# Patient Record
Sex: Male | Born: 1975 | Race: White | Hispanic: No | Marital: Married | Smoking: Never smoker
Health system: Southern US, Community
[De-identification: ages and names within clinical notes are randomized; demographics above are authoritative.]

## PROBLEM LIST (undated history)

## (undated) DIAGNOSIS — E785 Hyperlipidemia, unspecified: Secondary | ICD-10-CM

## (undated) DIAGNOSIS — Z9282 Status post administration of tPA (rtPA) in a different facility within the last 24 hours prior to admission to current facility: Secondary | ICD-10-CM

## (undated) DIAGNOSIS — D649 Anemia, unspecified: Secondary | ICD-10-CM

## (undated) DIAGNOSIS — R079 Chest pain, unspecified: Secondary | ICD-10-CM

## (undated) DIAGNOSIS — I10 Essential (primary) hypertension: Secondary | ICD-10-CM

## (undated) HISTORY — DX: Status post administration of tPA (rtPA) in a different facility within the last 24 hours prior to admission to current facility: Z92.82

## (undated) HISTORY — DX: Chest pain, unspecified: R07.9

## (undated) HISTORY — PX: TYMPANOPLASTY WITH GRAFT: SHX6567

---

## 2016-07-29 DIAGNOSIS — I639 Cerebral infarction, unspecified: Secondary | ICD-10-CM

## 2016-07-29 HISTORY — DX: Cerebral infarction, unspecified: I63.9

## 2016-07-30 ENCOUNTER — Inpatient Hospital Stay (HOSPITAL_COMMUNITY): Payer: 59

## 2016-07-30 ENCOUNTER — Inpatient Hospital Stay (HOSPITAL_COMMUNITY)
Admission: AD | Admit: 2016-07-30 | Discharge: 2016-07-31 | DRG: 065 | Disposition: A | Discharge status: Home / Self Care | Payer: 59 | Source: Other Acute Inpatient Hospital | Attending: Neurology | Admitting: Neurology

## 2016-07-30 DIAGNOSIS — I639 Cerebral infarction, unspecified: Secondary | ICD-10-CM | POA: Diagnosis present

## 2016-07-30 DIAGNOSIS — F419 Anxiety disorder, unspecified: Secondary | ICD-10-CM | POA: Diagnosis present

## 2016-07-30 DIAGNOSIS — R29703 NIHSS score 3: Secondary | ICD-10-CM | POA: Diagnosis present

## 2016-07-30 DIAGNOSIS — Z23 Encounter for immunization: Secondary | ICD-10-CM

## 2016-07-30 DIAGNOSIS — Z6838 Body mass index (BMI) 38.0-38.9, adult: Secondary | ICD-10-CM | POA: Diagnosis not present

## 2016-07-30 DIAGNOSIS — E669 Obesity, unspecified: Secondary | ICD-10-CM | POA: Diagnosis present

## 2016-07-30 DIAGNOSIS — R011 Cardiac murmur, unspecified: Secondary | ICD-10-CM | POA: Diagnosis present

## 2016-07-30 DIAGNOSIS — R2990 Unspecified symptoms and signs involving the nervous system: Secondary | ICD-10-CM | POA: Diagnosis present

## 2016-07-30 DIAGNOSIS — G8194 Hemiplegia, unspecified affecting left nondominant side: Secondary | ICD-10-CM | POA: Diagnosis present

## 2016-07-30 DIAGNOSIS — Z79899 Other long term (current) drug therapy: Secondary | ICD-10-CM

## 2016-07-30 DIAGNOSIS — I6789 Other cerebrovascular disease: Secondary | ICD-10-CM

## 2016-07-30 DIAGNOSIS — I10 Essential (primary) hypertension: Secondary | ICD-10-CM | POA: Diagnosis present

## 2016-07-30 DIAGNOSIS — E785 Hyperlipidemia, unspecified: Secondary | ICD-10-CM

## 2016-07-30 DIAGNOSIS — R51 Headache: Secondary | ICD-10-CM | POA: Diagnosis present

## 2016-07-30 DIAGNOSIS — K219 Gastro-esophageal reflux disease without esophagitis: Secondary | ICD-10-CM | POA: Diagnosis present

## 2016-07-30 DIAGNOSIS — Z9282 Status post administration of tPA (rtPA) in a different facility within the last 24 hours prior to admission to current facility: Secondary | ICD-10-CM

## 2016-07-30 HISTORY — DX: Unspecified symptoms and signs involving the nervous system: R29.90

## 2016-07-30 LAB — PROTIME-INR
INR: 1.18
PROTHROMBIN TIME: 15 s (ref 11.4–15.2)

## 2016-07-30 LAB — COMPREHENSIVE METABOLIC PANEL
ALT: 30 U/L (ref 17–63)
AST: 27 U/L (ref 15–41)
Albumin: 4 g/dL (ref 3.5–5.0)
Alkaline Phosphatase: 86 U/L (ref 38–126)
Anion gap: 9 (ref 5–15)
BUN: 11 mg/dL (ref 6–20)
CHLORIDE: 106 mmol/L (ref 101–111)
CO2: 24 mmol/L (ref 22–32)
Calcium: 9 mg/dL (ref 8.9–10.3)
Creatinine, Ser: 1 mg/dL (ref 0.61–1.24)
Glucose, Bld: 116 mg/dL — ABNORMAL HIGH (ref 65–99)
POTASSIUM: 3.5 mmol/L (ref 3.5–5.1)
SODIUM: 139 mmol/L (ref 135–145)
Total Bilirubin: 0.9 mg/dL (ref 0.3–1.2)
Total Protein: 6.9 g/dL (ref 6.5–8.1)

## 2016-07-30 LAB — URINALYSIS, COMPLETE (UACMP) WITH MICROSCOPIC
Bacteria, UA: NONE SEEN
Bilirubin Urine: NEGATIVE
GLUCOSE, UA: NEGATIVE mg/dL
HGB URINE DIPSTICK: NEGATIVE
Ketones, ur: NEGATIVE mg/dL
Leukocytes, UA: NEGATIVE
Nitrite: NEGATIVE
Protein, ur: NEGATIVE mg/dL
SPECIFIC GRAVITY, URINE: 1.025 (ref 1.005–1.030)
Squamous Epithelial / LPF: NONE SEEN
pH: 6 (ref 5.0–8.0)

## 2016-07-30 LAB — CBC
HCT: 45.9 % (ref 39.0–52.0)
Hemoglobin: 15.3 g/dL (ref 13.0–17.0)
MCH: 28.4 pg (ref 26.0–34.0)
MCHC: 33.3 g/dL (ref 30.0–36.0)
MCV: 85.3 fL (ref 78.0–100.0)
PLATELETS: 237 10*3/uL (ref 150–400)
RBC: 5.38 MIL/uL (ref 4.22–5.81)
RDW: 13.7 % (ref 11.5–15.5)
WBC: 7.2 10*3/uL (ref 4.0–10.5)

## 2016-07-30 LAB — LIPID PANEL
CHOL/HDL RATIO: 5.3 ratio
CHOLESTEROL: 176 mg/dL (ref 0–200)
HDL: 33 mg/dL — AB (ref >40)
LDL Cholesterol: 117 mg/dL — ABNORMAL HIGH (ref 0–99)
TRIGLYCERIDES: 131 mg/dL (ref <150)
VLDL: 26 mg/dL (ref 0–40)

## 2016-07-30 LAB — ECHOCARDIOGRAM COMPLETE
HEIGHTINCHES: 71 in
WEIGHTICAEL: 4405.67 [oz_av]

## 2016-07-30 LAB — TROPONIN I

## 2016-07-30 LAB — RAPID URINE DRUG SCREEN, HOSP PERFORMED
AMPHETAMINES: NOT DETECTED
BARBITURATES: NOT DETECTED
Benzodiazepines: NOT DETECTED
Cocaine: NOT DETECTED
OPIATES: NOT DETECTED
TETRAHYDROCANNABINOL: NOT DETECTED

## 2016-07-30 LAB — APTT: aPTT: 31 seconds (ref 24–36)

## 2016-07-30 LAB — MRSA PCR SCREENING: MRSA by PCR: NEGATIVE

## 2016-07-30 MED ORDER — OXYCODONE-ACETAMINOPHEN 5-325 MG PO TABS: 1.0000 | ORAL_TABLET | Freq: Three times a day (TID) | ORAL | Status: DC | PRN

## 2016-07-30 MED ORDER — CITALOPRAM HYDROBROMIDE 40 MG PO TABS
40.0000 mg | ORAL_TABLET | Freq: Every day | ORAL | Status: DC
  Administered 2016-07-30: 40 mg via ORAL
  Filled 2016-07-30: qty 4

## 2016-07-30 MED ORDER — CLEVIDIPINE BUTYRATE 0.5 MG/ML IV EMUL: 0.0000 mg/h | INTRAVENOUS | Status: DC

## 2016-07-30 MED ORDER — ATORVASTATIN CALCIUM 40 MG PO TABS
40.0000 mg | ORAL_TABLET | Freq: Every day | ORAL | Status: DC
  Administered 2016-07-30: 40 mg via ORAL
  Filled 2016-07-30: qty 1

## 2016-07-30 MED ORDER — ACETAMINOPHEN 325 MG PO TABS: 650.0000 mg | ORAL_TABLET | ORAL | Status: DC | PRN

## 2016-07-30 MED ORDER — SENNOSIDES-DOCUSATE SODIUM 8.6-50 MG PO TABS: 1.0000 | ORAL_TABLET | Freq: Every evening | ORAL | Status: DC | PRN

## 2016-07-30 MED ORDER — ACETAMINOPHEN 650 MG RE SUPP: 650.0000 mg | RECTAL | Status: DC | PRN

## 2016-07-30 MED ORDER — INFLUENZA VAC SPLIT QUAD 0.5 ML IM SUSY: 0.5000 mL | PREFILLED_SYRINGE | INTRAMUSCULAR | Status: DC

## 2016-07-30 MED ORDER — PANTOPRAZOLE SODIUM 40 MG IV SOLR: 40.0000 mg | Freq: Every day | INTRAVENOUS | Status: DC

## 2016-07-30 MED ORDER — ACETAMINOPHEN 160 MG/5ML PO SOLN: 650.0000 mg | ORAL | Status: DC | PRN

## 2016-07-30 MED ORDER — STROKE: EARLY STAGES OF RECOVERY BOOK
Freq: Once | Status: AC
  Administered 2016-07-31: 06:00:00
  Filled 2016-07-30: qty 1

## 2016-07-30 MED ORDER — PANTOPRAZOLE SODIUM 40 MG PO TBEC
40.0000 mg | DELAYED_RELEASE_TABLET | Freq: Every day | ORAL | Status: DC
  Administered 2016-07-30 – 2016-07-31 (×2): 40 mg via ORAL
  Filled 2016-07-30 (×2): qty 1

## 2016-07-30 MED ORDER — INFLUENZA VAC SPLIT QUAD 0.5 ML IM SUSY
0.5000 mL | PREFILLED_SYRINGE | INTRAMUSCULAR | Status: AC
  Administered 2016-07-30: 0.5 mL via INTRAMUSCULAR
  Filled 2016-07-30: qty 0.5

## 2016-07-30 MED ORDER — OXYCODONE-ACETAMINOPHEN 5-325 MG PO TABS
1.0000 | ORAL_TABLET | ORAL | Status: DC | PRN
  Administered 2016-07-30 (×3): 1 via ORAL
  Filled 2016-07-30 (×4): qty 1

## 2016-07-30 MED ORDER — IOPAMIDOL (ISOVUE-370) INJECTION 76%
INTRAVENOUS | Status: AC
  Administered 2016-07-30: 50 mL
  Filled 2016-07-30: qty 50

## 2016-07-30 MED ORDER — LABETALOL HCL 5 MG/ML IV SOLN: 20.0000 mg | Freq: Once | INTRAVENOUS | Status: DC

## 2016-07-30 MED ORDER — ASPIRIN EC 81 MG PO TBEC
81.0000 mg | DELAYED_RELEASE_TABLET | Freq: Every day | ORAL | Status: DC
  Administered 2016-07-30 – 2016-07-31 (×2): 81 mg via ORAL
  Filled 2016-07-30 (×2): qty 1

## 2016-07-30 MED ORDER — SODIUM CHLORIDE 0.9 % IV SOLN
INTRAVENOUS | Status: DC
  Administered 2016-07-30: 04:00:00 via INTRAVENOUS

## 2016-07-30 NOTE — Progress Notes (Signed)
STROKE TEAM PROGRESS NOTE   HISTORY OF PRESENT ILLNESS (per record) Robert Velez is an 41 y.o. male who was in USOH yesterday until 2:45 PM 07/29/2016 ([facility]KW), when he began having a severe headache. He took a Motrin and then at 3:30 PM his left foot began feeling "strange". He got out of his trunk and he fell, then noticing that his left foot and arm were weak. Also noted sensory numbness and tingling in the same distributions. Next, he drove himself home and from there to urgent care - he was told to go to the ED and arrived there at 4:30 PM. A telestroke consult was initiated and he was diagnosed with a stroke. IV tPA was administered and [facility] was [facility]. He states that his symptoms have significantly improved, with some residual numbness and tingling. He was admitted to the neuro ICU for further evaluation and treatment.   SUBJECTIVE (INTERVAL HISTORY) No family at bedside. MRI pending. Patient stated that after TPA he felt symptoms improved however not back to baseline yet. Still has mild left sided hemiparesis.    OBJECTIVE Temp:  [97.7 F (36.5 C)-98.3 F (36.8 C)] 97.9 F (36.6 C) (02/01 1600) Pulse Rate:  [73-88] 81 (02/01 1600) Cardiac Rhythm: Normal sinus rhythm (02/01 1600) Resp:  [12-24] 12 (02/01 1500) BP: (110-145)/(62-93) 118/72 (02/01 1600) SpO2:  [94 %-98 %] 94 % (02/01 1600) Weight:  [275 lb 5.7 oz (124.9 kg)] 275 lb 5.7 oz (124.9 kg) (02/01 0200)  CBC:   Recent Labs Lab 07/30/16 0350  WBC 7.2  HGB 15.3  HCT 45.9  MCV 85.3  PLT 237    Basic Metabolic Panel:   Recent Labs Lab 07/30/16 0350  NA 139  K 3.5  CL 106  CO2 24  GLUCOSE 116*  BUN 11  CREATININE 1.00  CALCIUM 9.0    Lipid Panel:     Component Value Date/Time   CHOL 176 07/30/2016 0350   TRIG 131 07/30/2016 0350   HDL 33 (L) 07/30/2016 0350   CHOLHDL 5.3 07/30/2016 0350   VLDL 26 07/30/2016 0350   LDLCALC 117 (H) 07/30/2016 0350   HgbA1c: No results found for:  HGBA1C Urine Drug Screen:     Component Value Date/Time   LABOPIA NONE DETECTED 07/30/2016 0356   COCAINSCRNUR NONE DETECTED 07/30/2016 0356   LABBENZ NONE DETECTED 07/30/2016 0356   AMPHETMU NONE DETECTED 07/30/2016 0356   THCU NONE DETECTED 07/30/2016 0356   LABBARB NONE DETECTED 07/30/2016 0356      IMAGING  CTA head and neck - OSH - unremarkable  MRI pending  TTE  - Normal LV systolic function; probable mild diastolic dysfunction;   mildly calcified aortic valve with trace AI; cannot exclude   oscillating density; suggest TEE to further assess.   PHYSICAL EXAM  Temp:  [97.7 F (36.5 C)-98.3 F (36.8 C)] 97.9 F (36.6 C) (02/01 1600) Pulse Rate:  [73-88] 81 (02/01 1600) Resp:  [12-24] 12 (02/01 1500) BP: (110-145)/(62-93) 118/72 (02/01 1600) SpO2:  [94 %-98 %] 94 % (02/01 1600) Weight:  [275 lb 5.7 oz (124.9 kg)] 275 lb 5.7 oz (124.9 kg) (02/01 0200)  General - Well nourished, well developed, in no apparent distress.  Ophthalmologic - Sharp disc margins OU.   Cardiovascular - Regular rate and rhythm.  Mental Status -  Level of arousal and orientation to time, place, and person were intact. Language including expression, naming, repetition, comprehension was assessed and found intact. Attention span and concentration were  normal. Recent and remote memory were intact. Fund of Knowledge was assessed and was intact.  Cranial Nerves II - XII - II - Visual field intact OU. III, IV, VI - Extraocular movements intact. V - Facial sensation intact bilaterally. VII - Facial movement intact bilaterally. VIII - Hearing & vestibular intact bilaterally. X - Palate elevates symmetrically. XI - Chin turning & shoulder shrug intact bilaterally. XII - Tongue protrusion intact.  Motor Strength - The patient's strength was normal in all extremities except subtle left arm pronator drift and left knee flexion 5-/5 and left foot DF 4/5.  Bulk was normal and fasciculations were  absent.   Motor Tone - Muscle tone was assessed at the neck and appendages and was normal.  Reflexes - The patient's reflexes were 1+ in all extremities and he had no pathological reflexes.  Sensory - Light touch, temperature/pinprick, vibration and proprioception were assessed and were symmetrical.    Coordination - The patient had normal movements in the hands and feet with no ataxia or dysmetria.  Tremor was absent.  Gait and Station - deferred.   ASSESSMENT/PLAN [name] is a 41 y.o. male with history of Heart murmur presenting with left-sided weakness and numbness. He was transferred to [facility] after receiving IV t-PA at OSH.   Possible stroke:  right subcortical infarct likely secondary to small vessel disease source  Resultant  subtle left-sided weakness  CTA head and neck at OSH unremarkable, no LVO  MRI  pending  2D Echo EF 60-65%  UDS negative  LDL 117  HgbA1c pending  SCDs for VTE prophylaxis Diet regular Room service appropriate? Yes; Fluid consistency: Thin  No antithrombotic prior to admission, now on No antithrombotic as within 24 hours of TPA. Plan aspirin 325 mg daily once 24 hour imaging negative for hemorrhage  Ongoing aggressive stroke risk factor management  Therapy recommendations:  CIR   Disposition:  pending   Hypertension  Stable  Blood pressure goal per post TPA guidelines   Hyperlipidemia  Home meds:  No statin  LDL 117, goal < 70  Add Lipitor 40 mg daily  Continue statin at discharge  Other Active Problems  Anxiety on Celexa  GERD, on Prilosec  Hospital day # 0  This patient is critically ill due to possible stroke s/p TPA and at significant risk of neurological worsening, death form recurrent stroke, hemorrhagic transformation, brain edema and cerebral herniation. This patient's care requires constant monitoring of vital signs, hemodynamics, respiratory and cardiac monitoring, review of multiple databases,  neurological assessment, discussion with family, other specialists and medical decision making of high complexity. I spent 35 minutes of neurocritical care time in the care of this patient.  Marvel Plan, MD PhD Stroke Neurology 07/30/2016 7:06 PM   To contact Stroke Continuity provider, please refer to [email or web address]. After hours, contact General Neurology

## 2016-07-30 NOTE — Progress Notes (Signed)
PT Cancellation Note  Patient Details Name: Robert Velez MRN: [id number] DOB: 12-12-1975   Cancelled Treatment:    Reason Eval/Treat Not Completed: Patient not medically ready Pt on strict bedrest. Will await increase in activity orders prior to initiation of PT evaluation. Will follow.   Blake DivineShauna A Laekyn Rayos 07/30/2016, 7:12 AM Mylo RedShauna Shyhiem Beeney, PT, DPT [phone]

## 2016-07-30 NOTE — Plan of Care (Signed)
Problem: Nutrition: Goal: Dietary intake will improve Outcome: Completed/Met Date Met: 07/30/16 Regular diet

## 2016-07-30 NOTE — Plan of Care (Signed)
Problem: Education: Goal: Knowledge of secondary prevention will improve Outcome: Progressing Discussed importance of recognizing signs and symptoms and importance of immediate medical attention.

## 2016-07-30 NOTE — Progress Notes (Signed)
Received from Neuro ICU via W/C, VSS, tele applied, oriented to room, safety precautions & pllan of care.

## 2016-07-30 NOTE — Evaluation (Addendum)
Physical Therapy Evaluation Patient Details Name: Robert Velez MRN: [id number] DOB: 08-Dec-1975 Today's Date: 07/30/2016   History of Present Illness  Patient is a 41 y/o male presents with LLE weakness and headache. [facility] Scans not updated in computer yet. s/p tPA.  Clinical Impression  Patient presents with left sided weakness, impaired sensation LUE/LE, blurry vision left eye and impaired mobility s/p above. Tolerated gait training with Min A for left knee instability. Pt with left foot drop demonstrating steppage gait to clear LLE during swing phase. Fatigues quickly due to increased WB through BUEs. Pt independent PTA. Would benefit from CIR to maximize independence and mobility prior to return home. Will follow acutely.    Follow Up Recommendations CIR;Supervision/Assistance - 24 hour    Equipment Recommendations  Other (comment) (TBA)    Recommendations for Other Services Rehab consult     Precautions / Restrictions Precautions Precautions: Fall Restrictions Weight Bearing Restrictions: No      Mobility  Bed Mobility Overal bed mobility: Needs Assistance Bed Mobility: Supine to Sit     Supine to sit: Supervision;HOB elevated     General bed mobility comments: No assist needed.   Transfers Overall transfer level: Needs assistance Equipment used: Rolling walker (2 wheeled);None Transfers: Sit to/from Stand Sit to Stand: Min guard         General transfer comment: Min guard to stand from EOB x2. 2 attempts to elevate from bed. Felt more comfortable having RW to hold onto once standing.  Ambulation/Gait Ambulation/Gait assistance: Min assist;+2 safety/equipment Ambulation Distance (Feet): 75 Feet Assistive device: Rolling walker (2 wheeled) Gait Pattern/deviations: Step-through pattern;Decreased stance time - left;Decreased step length - right;Trunk flexed;Steppage Gait velocity: decreased Gait velocity interpretation: Below normal speed  for age/gender General Gait Details: Pt with left foot drop and left knee instability during gait. Increased WB through BLEs. pt not placing full weight through LLE. Requires Min A to stabilize left knee during stance phase. Fatigues which results in worsening foot drop. 2 standing rest breaks. VSS.  Stairs            Wheelchair Mobility    Modified Rankin (Stroke Patients Only) Modified Rankin (Stroke Patients Only) Pre-Morbid Rankin Score: No symptoms Modified Rankin: Moderately severe disability     Balance Overall balance assessment: Needs assistance Sitting-balance support: Feet supported;No upper extremity supported Sitting balance-Leahy Scale: Good Sitting balance - Comments: Able to donn socks bringing foot onto thigh without difficulty.    Standing balance support: During functional activity Standing balance-Leahy Scale: Fair Standing balance comment: Able to stand without UE support.                             Pertinent Vitals/Pain Pain Assessment: Faces Faces Pain Scale: Hurts even more Pain Location: headache Pain Descriptors / Indicators: Headache Pain Intervention(s): Monitored during session;Premedicated before session;Repositioned    Home Living Family/patient expects to be discharged to:: Private residence Living Arrangements: Spouse/significant other;Children;Parent Available Help at Discharge: Family;Available 24 hours/day Type of Home: House Home Access: Stairs to enter   [employer] CorporationEntrance Stairs-Number of Steps: 2 Home Layout: Two level;Laundry or work area in basement;Able to live on main level with Pilgrim's Pridebedroom/bathroom Home Equipment: None      Prior Function Level of Independence: Independent         Comments: Works as Art gallery managerngineer- drives, cooks, 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 LLE Sensation: decreased light touch       Communication   Communication: No difficulties  Cognition Arousal/Alertness: Awake/alert Behavior During Therapy: WFL for tasks assessed/performed Overall Cognitive Status: Within Functional Limits for tasks assessed                      General Comments General comments (skin integrity, edema, etc.): Wife present during session.    Exercises     Assessment/Plan    PT Assessment Patient needs continued PT services  PT Problem List Decreased strength;Decreased mobility;Impaired tone;Decreased activity tolerance;Decreased balance;Impaired sensation;Decreased knowledge of use of DME          PT Treatment Interventions Gait training;Therapeutic exercise;Patient/family education;Balance training;Functional mobility training;Stair training;Neuromuscular re-education;DME instruction;Therapeutic activities    PT Goals (Current goals can be found in the Care Plan section)  Acute Rehab PT Goals Patient Stated Goal: to return to independence PT Goal Formulation: With patient Time For Goal Achievement: 08/13/16 Potential to Achieve Goals: Good    Frequency Min 3X/week   Barriers to discharge        Co-evaluation               End of Session Equipment Utilized During Treatment: Gait belt Activity Tolerance: Patient tolerated treatment well Patient left: in bed;with call bell/phone within reach;with SCD's reapplied;with family/visitor present Nurse Communication: Mobility status         Time: 1101-1135 PT Time Calculation (min) (ACUTE ONLY): 34 min   Charges:   PT Evaluation $PT Eval Moderate Complexity: 1 Procedure PT Treatments $Gait Training: 8-22 mins   PT G Codes:        Meegan Shanafelt A Izabellah Dadisman 07/30/2016, 4:08 PM Mylo Red, PT,  DPT [phone]

## 2016-07-30 NOTE — H&P (Signed)
Admission H&P    Chief Complaint: Stroke, status post tPA at OSH  HPI: Robert Velez is an 41 y.o. male who was in USOH yesterday until 2:45 PM, when he began having a severe headache. He took a Motrin and then at 3:30 PM his left foot began feeling "strange". He got out of his trunk and he fell, then noticing that his left foot and arm were weak. Also noted sensory numbness and tingling in the same distributions. Next, he drove himself home and from there to urgent care - he was told to go to the ED and arrived there at 4:30 PM. A telestroke consult was initiated and he was diagnosed with a stroke. IV tPA was administered and [facility] He states that his symptoms have significantly improved, with some residual numbness and tingling.   PMHx:  Anxiety "Dr. told me in the past that I had a heart murmur"  No past surgical history on file.  No family history on file. Social History: Denies smoking, EtOH or drug use  Allergies: NKDA  Home medications: Citalopram Omeprazole  ROS: 7/10 right temporal headache. No N/V currently. No chest or abdominal pain. No cough or SOB.   Physical Examination: Blood pressure (!) 145/92, pulse 75, resp. rate 20, SpO2 96 %.  HEENT-  Normocephalic/atraumatic. Lungs - No gross wheezing. Respirations unlabored.  Extremities - No edema.   Neurologic Examination: Ment: Intact to complex questions and commands. No receptive or expressive aphasia.  CN: PERRL, visual fields intact, EOMI, no nystagmus, facial temp sensation intact, VII normal bilaterally, hearing intact to questioning, no hypophonia, shoulder shrug normal, tongue protrudes midline.  Motor: 5/5 bilateral upper and lower extremities without asymmetry Sensory: Mild dysesthesia with FT testing of left hand and foot Reflexes: Normoactive x 4 without asymmetry. Toes downgoing bilaterally.  Cerebellar: No ataxia with FNF bilaterally Gait: Deferred.   No results found for this  or any previous visit (from the past 48 hour(s)). No results found.  Assessment: 41 y.o. male with acute onset of left arm and foot weakness, status post IV tPA at OSH.  1. No definite stroke risk factors.  2. Improved with tPA. Some residual sensory symptoms.  3. History of anxiety.   Plan: 1. Post-tPA order set to include frequent neuro checks and BP management.  2. No antiplatelet medications or anticoagulants. Can consider if CT head 24 hours following tPA is negative for hemorrhagic conversion.  3. DVT prophylaxis with SCDs 4. Echocardiogram 5. Carotid dopplers 6. Telemetry monitoring 7. MRI, MRA  of the brain without contrast 8. PT consult, OT consult, Speech consult 9. HgbA1c, fasting lipid panel  Electronically signed: Dr. Caryl PinaEric Giselle Brutus 07/30/2016, 2:11 AM

## 2016-07-30 NOTE — Progress Notes (Signed)
  Echocardiogram 2D Echocardiogram has been performed.  Robert Velez, Robert Velez R 07/30/2016, 3:39 PM

## 2016-07-30 NOTE — Progress Notes (Addendum)
[facility]. TPA given at 1736. Patient arrived via EMS on telemetry, pressure 125/91, O2 98% on RA, HR 81. Dr Otelia LimesLindzen notified of admission. Initial NIH 3, will continue to monitor patient.

## 2016-07-30 NOTE — Progress Notes (Signed)
Inpatient Rehabilitation  Per therapy request patient was screened by Grete Bosko for appropriateness for an Inpatient Acute Rehab consult.  At this time we are recommending an Inpatient Rehab consult.  MD paged; please order if you are agreeable.    Alixandra Alfieri, M.A., CCC/SLP Admission Coordinator  [facility]  Cell [phone]  

## 2016-07-30 NOTE — Plan of Care (Signed)
Problem: Education: Goal: Knowledge of disease or condition will improve Outcome: Progressing PT/OT consult

## 2016-07-31 DIAGNOSIS — G8194 Hemiplegia, unspecified affecting left nondominant side: Secondary | ICD-10-CM

## 2016-07-31 DIAGNOSIS — I10 Essential (primary) hypertension: Secondary | ICD-10-CM | POA: Diagnosis present

## 2016-07-31 DIAGNOSIS — K219 Gastro-esophageal reflux disease without esophagitis: Secondary | ICD-10-CM

## 2016-07-31 DIAGNOSIS — I639 Cerebral infarction, unspecified: Principal | ICD-10-CM

## 2016-07-31 DIAGNOSIS — F419 Anxiety disorder, unspecified: Secondary | ICD-10-CM

## 2016-07-31 HISTORY — DX: Anxiety disorder, unspecified: F41.9

## 2016-07-31 HISTORY — DX: Gastro-esophageal reflux disease without esophagitis: K21.9

## 2016-07-31 HISTORY — DX: Essential (primary) hypertension: I10

## 2016-07-31 LAB — HEMOGLOBIN A1C
Hgb A1c MFr Bld: 5.5 % (ref 4.8–5.6)
Mean Plasma Glucose: 111 mg/dL

## 2016-07-31 MED ORDER — ASPIRIN 81 MG PO TBEC: 81.0000 mg | DELAYED_RELEASE_TABLET | Freq: Every day | ORAL | 2 refills | Status: AC

## 2016-07-31 MED ORDER — ATORVASTATIN CALCIUM 40 MG PO TABS: 40.0000 mg | ORAL_TABLET | Freq: Every day | ORAL | 2 refills | Status: DC

## 2016-07-31 NOTE — Progress Notes (Signed)
Occupational Therapy Evaluation and treatment Patient Details Name: Robert Velez MRN: [id number] DOB: Dec 29, 1975 Today's Date: 07/31/2016    History of Present Illness Patient is a 41 y/o male presents with LLE weakness and headache. [facility] [facility]  A telestroke consult was initiated and he was diagnosed with a stroke. IV tPA was administered and [facility] was [facility]. He states that his symptoms have significantly improved, with some residual numbness and tingling   Clinical Impression   Pt seen for 2 visits. PTA, pt independent with ADL and mobility.  Pt with residual L sided weakness, L weaker than arm). Completed education regarding ADL and safe mobility for ADL. Pt having difficulty lifting L leg over tub. Practiced tub transfer with pt and wife using RW and 3 in1. Pt/wife able to return demonstrate safe technique. Pt states he feels safer with using RW at this time due to L LE weakness. Contacted case manager regarding need for 3 in 1 and RW to facilitate safe DC home. Also recommend outpt OT to facilitate safe return back to work.     Follow Up Recommendations  Outpatient OT;Supervision - Intermittent    Equipment Recommendations  3 in 1 bedside commode;Other (comment) (RW)    Recommendations for Other Services       Precautions / Restrictions Precautions Precautions: Fall Restrictions Weight Bearing Restrictions: No      Mobility Bed Mobility   Transfers Overall transfer level: Needs assistance Equipment used: None Transfers: Sit to/from Stand Sit to Stand: Modified independent (Device/Increase time)             Balance Overall balance assessment: Needs assistance Sitting-balance support: Feet supported;No upper extremity supported Sitting balance-Leahy Scale: Good Sitting balance - Comments: Able to donn socks bringing foot onto thigh without difficulty.    Standing balance support: During functional activity Standing balance-Leahy  Scale: Fair Standing balance comment: Difficulty with stepping over tub                            ADL Overall ADL's : Needs assistance/impaired                                 Tub/ Shower Transfer: Minimal assistance;Rolling walker   Functional mobility during ADLs: Supervision/safety;Rolling walker General ADL Comments: Pt overall set up for bathing and dressing. Will need to practice tub transfer  Practiced tub transfer on second visit. Wife/pt able to safely return demonstrate.     Vision Vision Assessment?: Yes Eye Alignment: Within Functional Limits Ocular Range of Motion: Within Functional Limits Alignment/Gaze Preference: Within Defined Limits Tracking/Visual Pursuits: Able to track stimulus in all quads without difficulty Saccades: Within functional limits Convergence: Within functional limits Visual Fields: No apparent deficits Additional Comments: complains of mild "blurriness in L eye"   Perception     Praxis      Pertinent Vitals/Pain Pain Assessment: No/denies pain Faces Pain Scale: Hurts even more     Hand Dominance Right   Extremity/Trunk Assessment Upper Extremity Assessment Upper Extremity Assessment: LUE deficits/detail LUE Deficits / Details: mild coordination deficits. strength overall WFL although shouler strength @ 4/5   Lower Extremity Assessment LLE Deficits / Details: Grossly ~1/5 DF/PF, 2+/5 knee extension, flexion, 2+/5 hip flexion. LLE Sensation: decreased light touch   Cervical / Trunk Assessment Cervical / Trunk Assessment: Normal   Communication Communication Communication: No difficulties  Cognition Arousal/Alertness: Awake/alert Behavior During Therapy: WFL for tasks assessed/performed Overall Cognitive Status: Impaired/Different from baseline Area of Impairment: Memory     Memory: Decreased short-term memory             General Comments       Exercises   Other Exercises Other Exercises:  U and LE coordination activities   Shoulder Instructions      Home Living Family/patient expects to be discharged to:: Private residence Living Arrangements: Spouse/significant other;Children;Parent Available Help at Discharge: Family;Available 24 hours/day Type of Home: House Home Access: Stairs to enter [employer] of Steps: 2   Home Layout: Two level;Laundry or work area in basement;Able to live on main level with bedroom/bathroom     Bathroom Shower/Tub: Therapist, sports characteristics: Engineer, building services: Pharmacist, community: Yes How Accessible: Accessible via walker Home Equipment: None      Lives With: Spouse    Prior Functioning/Environment Level of Independence: Independent        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        OT Problem List: Decreased strength;Decreased activity tolerance;Impaired balance (sitting and/or standing);Impaired vision/perception;Decreased coordination;Decreased safety awareness;Decreased knowledge of use of DME or AE;Impaired sensation;Impaired tone;Obesity   OT Treatment/Interventions:      OT Goals(Current goals can be found in the care plan section) Acute Rehab OT Goals Patient Stated Goal: to return to independence OT Goal Formulation: All assessment and education complete, DC therapy  OT Frequency:     Barriers to D/C:            Co-evaluation              End of Session Equipment Utilized During Treatment: Gait belt;Rolling walker Nurse Communication: Mobility status;Other (comment) (DC needs)  Activity Tolerance: Patient tolerated treatment well Patient left: in chair;with call bell/phone within reach;with family/visitor present   Time: 1610-9604 OT Time Calculation (min): 29 min Charges:  OT General Charges $OT Visit:  (2 visits) OT Evaluation $OT Eval Moderate Complexity: 1 Procedure OT Treatments $Self Care/Home Management : 23-37  mins G-Codes:    Sarea Fyfe,HILLARY 24-Aug-2016, 3:57 PM   [facility], OT/L  [phone] 08/24/16

## 2016-07-31 NOTE — Progress Notes (Signed)
Rehab admissions - Please see rehab consult done by Dr. Riley KillSwartz today.  Awaiting OT evaluation and PT update today.  [facility] and his wife can assist.  [facility] has [facility] commercial insurance so would need authorization if inpatient rehab becomes the plan.  Call me for questions.  #[phone]

## 2016-07-31 NOTE — Progress Notes (Signed)
D/c instructions reviewed with patient and spouse. Encouraged follow on appointments

## 2016-07-31 NOTE — Progress Notes (Signed)
Rehab admissions - Please see PT note recommending outpatient therapies.  Patient doing very well and will not require inpatient rehab.  #[phone]

## 2016-07-31 NOTE — Evaluation (Signed)
Speech Language Pathology Evaluation Patient Details Name: Robert Velez MRN: [id number] DOB: 1976-02-23 Today's Date: 07/31/2016 Time: [id number] SLP Time Calculation (min) (ACUTE ONLY): 18 min  Problem List:  Patient Active Problem List   Diagnosis Date Noted  . Acute ischemic stroke (HCC) 07/30/2016  . Stroke (cerebrum) (HCC) 07/30/2016  . Hyperlipidemia   . Tissue plasminogen activator (t-PA) administered at other facility within 24 hours prior to current admission    Past Medical History: No past medical history on file. Past Surgical History: No past surgical history on file. HPI:  Pt is a 41 y.o. male admitted 2/1 for left foot/ arm weakness, numbness and tingling. Symptoms significantly improved shortly after admission except for residual numbness and tingling. MRI showed no acute findings. Speech language eval ordered as part of stroke workup.    Assessment / Plan / Recommendation Clinical Impression  Pt currently presenting with mild cognitive deficits in the area of short-term memory and slowed processing of verbal information. Pt received a score of 26/30 on the MoCA- recalled 1 out of 5 words in delayed recall task and difficulty copying picture. Pt aware of deficits and expressed new difficulties with short term memory- recall of new information, names, etc. Wife reported that pt seems to repeat himself not realizing that he has already asked a question or shared a piece of information. Pt would benefit from continued speech therapy at next level of care to address short-term memory skills so that pt can have a successful transition back to work. Pt and wife agree with plan. Reported plan is for pt to move on to CIR. Will continue to follow.    SLP Assessment  Patient needs continued Speech Lanaguage Pathology Services    Follow Up Recommendations  Inpatient Rehab    Frequency and Duration min 1 x/week  1 week      SLP Evaluation Cognition  Overall Cognitive Status:  Impaired/Different from baseline Arousal/Alertness: Awake/alert Orientation Level: Oriented X4 Attention: Selective;Alternating Selective Attention: Appears intact Alternating Attention: Appears intact Memory: Impaired Memory Impairment: Decreased recall of new information;Decreased short term memory Decreased Short Term Memory: Verbal basic Awareness: Appears intact Problem Solving: Appears intact Safety/Judgment: Appears intact       Comprehension  Auditory Comprehension Overall Auditory Comprehension: Appears within functional limits for tasks assessed Yes/No Questions: Within Functional Limits Commands: Within Functional Limits Conversation: Complex Visual Recognition/Discrimination Discrimination:  (reports blurriness from L eye) Reading Comprehension Reading Status: Within funtional limits    Expression Expression Primary Mode of Expression: Verbal Verbal Expression Overall Verbal Expression: Appears within functional limits for tasks assessed Initiation: No impairment Level of Generative/Spontaneous Verbalization: Conversation Repetition: No impairment Naming: No impairment Pragmatics: No impairment Non-Verbal Means of Communication: Not applicable Written Expression Dominant Hand: Right   Oral / Motor  Oral Motor/Sensory Function Overall Oral Motor/Sensory Function: Within functional limits Motor Speech Overall Motor Speech: Appears within functional limits for tasks assessed Respiration: Within functional limits Phonation: Normal Resonance: Within functional limits Articulation: Within functional limitis Intelligibility: Intelligible Motor Planning: Witnin functional limits Motor Speech Errors: Not applicable   GO                    Metro KungOleksiak, Coulter Oldaker K, MA, CCC-SLP 07/31/2016, 10:37 AM [phone]

## 2016-07-31 NOTE — Discharge Summary (Signed)
Stroke Discharge Summary  Patient ID: Robert Velez    l   MRN: [id number]      DOB: 1975-10-26  Date of Admission: 07/30/2016 Date of Discharge: 07/31/2016  Attending Physician:  Marvel PlanJindong Kinga Cassar, MD, Stroke MD Patient's PCP:  No primary care provider on file.  DISCHARGE DIAGNOSIS: Principal Problem:   Stroke-like episode (HCC) s/p tPA Active Problems:   Hyperlipidemia   Tissue plasminogen activator (t-PA) administered at other facility within 24 hours prior to current admission   Essential hypertension   Anxiety   GERD (gastroesophageal reflux disease)  BMI: Body mass index is 38.4 kg/m.  No past medical history on file. No past surgical history on file.  Allergies as of 07/31/2016   No Known Allergies     Medication List    TAKE these medications   aspirin 81 MG EC tablet Take 1 tablet (81 mg total) by mouth daily. Start taking on:  08/01/2016   atorvastatin 40 MG tablet Commonly known as:  LIPITOR Take 1 tablet (40 mg total) by mouth daily at 6 PM.   citalopram 40 MG tablet Commonly known as:  CELEXA Take 40 mg by mouth daily.   omeprazole 20 MG capsule Commonly known as:  PRILOSEC Take 20 mg by mouth daily.   VITAMIN B 12 PO Take 1 tablet by mouth daily.       LABORATORY STUDIES CBC    Component Value Date/Time   WBC 7.2 07/30/2016 0350   RBC 5.38 07/30/2016 0350   HGB 15.3 07/30/2016 0350   HCT 45.9 07/30/2016 0350   PLT 237 07/30/2016 0350   MCV 85.3 07/30/2016 0350   MCH 28.4 07/30/2016 0350   MCHC 33.3 07/30/2016 0350   RDW 13.7 07/30/2016 0350   CMP    Component Value Date/Time   NA 139 07/30/2016 0350   K 3.5 07/30/2016 0350   CL 106 07/30/2016 0350   CO2 24 07/30/2016 0350   GLUCOSE 116 (H) 07/30/2016 0350   BUN 11 07/30/2016 0350   CREATININE 1.00 07/30/2016 0350   CALCIUM 9.0 07/30/2016 0350   PROT 6.9 07/30/2016 0350   ALBUMIN 4.0 07/30/2016 0350   AST 27 07/30/2016 0350   ALT 30 07/30/2016 0350   ALKPHOS 86 07/30/2016 0350    BILITOT 0.9 07/30/2016 0350   GFRNONAA >60 07/30/2016 0350   GFRAA >60 07/30/2016 0350   COAGS Lab Results  Component Value Date   INR 1.18 07/30/2016   Lipid Panel    Component Value Date/Time   CHOL 176 07/30/2016 0350   TRIG 131 07/30/2016 0350   HDL 33 (L) 07/30/2016 0350   CHOLHDL 5.3 07/30/2016 0350   VLDL 26 07/30/2016 0350   LDLCALC 117 (H) 07/30/2016 0350   HgbA1C  Lab Results  Component Value Date   HGBA1C 5.5 07/30/2016   Cardiac Panel (last 3 results)   Recent Labs  07/30/16 0350  TROPONINI <0.03   Urinalysis    Component Value Date/Time   COLORURINE YELLOW 07/30/2016 0356   APPEARANCEUR CLEAR 07/30/2016 0356   LABSPEC 1.025 07/30/2016 0356   PHURINE 6.0 07/30/2016 0356   GLUCOSEU NEGATIVE 07/30/2016 0356   HGBUR NEGATIVE 07/30/2016 0356   BILIRUBINUR NEGATIVE 07/30/2016 0356   KETONESUR NEGATIVE 07/30/2016 0356   PROTEINUR NEGATIVE 07/30/2016 0356   NITRITE NEGATIVE 07/30/2016 0356   LEUKOCYTESUR NEGATIVE 07/30/2016 0356   Urine Drug Screen     Component Value Date/Time   LABOPIA NONE DETECTED 07/30/2016 0356   COCAINSCRNUR NONE DETECTED 07/30/2016  0356   LABBENZ NONE DETECTED 07/30/2016 0356   AMPHETMU NONE DETECTED 07/30/2016 0356   THCU NONE DETECTED 07/30/2016 0356   LABBARB NONE DETECTED 07/30/2016 0356     SIGNIFICANT DIAGNOSTIC STUDIES  CTA head and neck - [facility] - unremarkable  Mr Brain Wo Contrast 07/30/2016 1. Normal brain MRI. No acute intracranial infarct or other abnormality identified. 2. Moderate inflammatory/allergic paranasal sinus disease.   TTE  - Normal LV systolic function; probable mild diastolic dysfunction;mildly calcified aortic valve with trace AI; cannot excludeoscillating density; suggest TEE to further assess.     HISTORY OF PRESENT ILLNESS Robert Velez an 41 y.o.malewho was in [facility] until 2:45 PM 07/29/2016 (LKW), when he began having a severe headache. He took a Motrin  and then at 3:30 PM his left foot began feeling "strange". He got out of his trunk and he fell, then noticing that his left foot and arm were weak. Also noted sensory numbness and tingling in the same distributions. Next, he drove himself home and from there to urgent care - he was told to go to the ED and arrived there at 4:30 PM. A telestroke consult was initiated and he was diagnosed with a stroke. IV tPA was administered and he was then transferred to [facility]. He states that his symptoms have significantly improved, with some residual numbness and tingling. He was admitted to the neuro ICU for further evaluation and treatment.    HOSPITAL COURSE [name] is a 41 y.o. male with history of Heart murmur presenting with left-sided weakness and numbness. He was transferred to [facility] after receiving IV t-PA at [facility].   Stroke like symptoms, right brain s/p IV tPA, likely secondary to small vessel disease source  Resultant  subtle left-sided weakness  CTA head and neck at OSH unremarkable, no LVO  MRI  No acute stroke   2D Echo EF 60-65%  UDS negative  LDL 117  HgbA1c 5.5  No antithrombotic prior to admission, 24 hours post TPA started on aspirin 81 mg daily. Continue at discharge.   Ongoing aggressive stroke risk factor management  Therapy recommendations:  CIR. given progression, patient prefers outpatient therapy. Rehabilitation physicians agree    Disposition:   return home with outpatient PT and OT   Hypertension  Stable  Hyperlipidemia  Home meds:  No statin  LDL 117  Add Lipitor 40 mg daily  Continue statin at discharge  Other Active Problems  Anxiety on Celexa  GERD, on Prilosec   DISCHARGE EXAM Blood pressure 135/87, pulse 98, temperature 97.8 F (36.6 C), temperature source Oral, resp. rate 18, height 5\' 11"  (1.803 m), weight 124.9 kg (275 lb 5.7 oz), SpO2 97 %. General - Well nourished, well developed, in no apparent  distress.  Ophthalmologic - Sharp disc margins OU.   Cardiovascular - Regular rate and rhythm.  Mental Status -  Level of arousal and orientation to time, place, and person were intact. Language including expression, naming, repetition, comprehension was assessed and found intact. Attention span and concentration were normal. Recent and remote memory were intact. Fund of Knowledge was assessed and was intact.  Cranial Nerves II - XII - II - Visual field intact OU. III, IV, VI - Extraocular movements intact. V - Facial sensation intact bilaterally. VII - Facial movement intact bilaterally. VIII - Hearing & vestibular intact bilaterally. X - Palate elevates symmetrically. XI - Chin turning & shoulder shrug intact bilaterally. XII - Tongue protrusion intact.  Motor Strength -  The patient's strength was normal in all extremities except left foot DF 5-/5.  Bulk was normal and fasciculations were absent.   Motor Tone - Muscle tone was assessed at the neck and appendages and was normal.  Reflexes - The patient's reflexes were 1+ in all extremities and he had no pathological reflexes.  Sensory - Light touch, temperature/pinprick, vibration and proprioception were assessed and were symmetrical.    Coordination - The patient had normal movements in the hands and feet with no ataxia or dysmetria.  Tremor was absent.  Gait and Station - deferred.   Discharge Diet   Diet regular Room service appropriate? Yes; Fluid consistency: Thin liquids  DISCHARGE PLAN  Disposition:  Home  Outpatient PT and OT  aspirin 81 mg daily for secondary stroke prevention.  Ongoing risk factor control by Primary Care Physician at time of discharge  Follow-up No primary care provider on file. in 2 weeks.  Follow-up with carolyn martin NP at [facility] in 6 weeks, office to schedule an appointment.  30 minutes were spent preparing discharge.  Marvel Plan, MD PhD Stroke  Neurology 08/01/2016 12:38 AM

## 2016-07-31 NOTE — Care Management Note (Signed)
Case Management Note  Patient Details  Name: Robert Velez MRN: [id number] Date of Birth: 03/10/1976  Subjective/Objective:                    Action/Plan: CM consulted for outpatient therapy and DME. Pt is interested in attending outpatient therapy in [place]. [facility]. [facility] and they do have outpatient PT and OT. Information they requested was faxed to them at: [phone]. Pt also with orders for walker and 3 in 1. [facility] notified and will deliver the equipment to the room. Bedside RN updated.   Expected Discharge Date:  07/31/16               Expected Discharge Plan:  Home/Self Care  In-House Referral:     Discharge planning Services  CM Consult  Post Acute Care Choice:  Durable Medical Equipment Choice offered to:  Patient  DME Arranged:  3-N-1, Walker rolling DME Agency:  [facility]  HH [facility]    [facility] Agency:     Status of Service:  Completed, signed off  If discussed at Long Length of Stay Meetings, dates discussed:    Additional Comments:  Kermit BaloKelli F Nature Vogelsang, RN 07/31/2016, 4:11 PM

## 2016-07-31 NOTE — Care Management Note (Signed)
Case Management Note  Patient Details  Name: Robert Velez MRN: [id number] Date of Birth: 11-26-1975  Subjective/Objective:            Patient was admitted with left-sided weakness, code stroke. Lives at home with spouse. He is currently being evaluated for CIR. CM will follow for discharge needs pending PT/OT evals and physician orders.         Action/Plan:   Expected Discharge Date:                  Expected Discharge Plan:     In-House Referral:     Discharge planning Services     Post Acute Care Choice:    Choice offered to:     DME Arranged:    DME Agency:     HH Arranged:    HH Agency:     Status of Service:     If discussed at [employer] Length of Stay Meetings, dates discussed:    Additional Comments:  Anda KraftRobarge, Lenora Gomes C, RN 07/31/2016, 12:12 PM

## 2016-07-31 NOTE — Consult Note (Signed)
Physical Medicine and Rehabilitation Consult Reason for Consult: Right subcortical CVA secondary to small vessel disease Referring Physician: Dr.Xu   HPI: Robert Velez is a 41 y.o. right handed male with history of depression, GERD. Per chart review patient lives with spouse independent prior to admission works for an Public relations account executive firm. Two-level home with bedroom on main level. Wife works from the home and can assist. Presented to [facility] with severe headache and left-sided numbness and sensory changes. He drove himself to the urgent care they told him to go to the ED where CT was completed showing stroke and formal report not provided. Patient did receive TPA and was transferred to [facility] for further evaluation. MRI showed normal brain MRI. Neurology follow-up suspect subcortical infarct. UDS was negative. Troponin unremarkable. Echocardiogram with ejection fraction 65% no wall motion abnormalities. Neurology consulted presently on aspirin for CVA prophylaxis. Tolerating a regular diet. Physical therapy evaluation completed 07/30/2016 with recommendations of physical medicine rehabilitation consult.   Review of Systems  Constitutional: Negative for chills and fever.  HENT: Negative for hearing loss and tinnitus.   Eyes: Positive for blurred vision. Negative for double vision.  Respiratory: Negative for cough and shortness of breath.   Cardiovascular: Negative for chest pain, palpitations and leg swelling.  Gastrointestinal: Positive for constipation. Negative for nausea and vomiting.  Genitourinary: Negative for dysuria, flank pain and hematuria.  Musculoskeletal: Negative for falls.  Skin: Negative for rash.  Neurological: Positive for sensory change, weakness and headaches. Negative for seizures.  All other systems reviewed and are negative.  No past medical history on file. No past surgical history on file. No family history on file. Social History:  has no  tobacco, alcohol, and drug history on file. Allergies: No Known Allergies Medications Prior to Admission  Medication Sig Dispense Refill  . citalopram (CELEXA) 40 MG tablet Take 40 mg by mouth daily.    . Cyanocobalamin (VITAMIN B 12 PO) Take 1 tablet by mouth daily.    Marland Kitchen omeprazole (PRILOSEC) 20 MG capsule Take 20 mg by mouth daily.      Home: Home Living Family/patient expects to be discharged to:: Private residence Living Arrangements: Spouse/significant other, Children, Parent Available Help at Discharge: Family, Available 24 hours/day Type of Home: House Home Access: Stairs to enter [employer] of Steps: 2 Home Layout: Two level, Laundry or work area in basement, Able to live on main level with bedroom/bathroom Bathroom Shower/Tub: Proofreader: None  Functional History: Prior Function Level of Independence: Independent 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 has 35 month old Functional Status:  Mobility: Bed Mobility Overal bed mobility: Needs Assistance Bed Mobility: Supine to Sit Supine to sit: Supervision, HOB elevated General bed mobility comments: No assist needed.  Transfers Overall transfer level: Needs assistance Equipment used: Rolling walker (2 wheeled), None Transfers: Sit to/from Stand Sit to Stand: Min guard General transfer comment: Min guard to stand from EOB x2. 2 attempts to elevate from bed. Felt more comfortable having RW to hold onto once standing. Ambulation/Gait Ambulation/Gait assistance: Min assist, +2 safety/equipment Ambulation Distance (Feet): 75 Feet Assistive device: Rolling walker (2 wheeled) Gait Pattern/deviations: Step-through pattern, Decreased stance time - left, Decreased step length - right, Trunk flexed, Steppage General Gait Details: Pt with left foot drop and left knee instability during gait. Increased WB through BLEs. pt not placing full weight through LLE. Requires Min A to stabilize  left knee during stance phase. Fatigues which results in  worsening foot drop. 2 standing rest breaks. VSS. Gait velocity: decreased Gait velocity interpretation: Below normal speed for age/gender    ADL:    Cognition: Cognition Overall Cognitive Status: Within Functional Limits for tasks assessed Orientation Level: Oriented X4 Cognition Arousal/Alertness: Awake/alert Behavior During Therapy: WFL for tasks assessed/performed Overall Cognitive Status: Within Functional Limits for tasks assessed  Blood pressure 122/79, pulse 72, temperature 97.8 F (36.6 C), temperature source Oral, resp. rate 18, height 5\' 11"  (1.803 m), weight 124.9 kg (275 lb 5.7 oz), SpO2 96 %. Physical Exam  Vitals reviewed. Constitutional: He is oriented to person, place, and time. He appears well-developed.  HENT:  Head: Normocephalic.  Eyes: EOM are normal.  Neck: Normal range of motion. Neck supple. No thyromegaly present.  Cardiovascular: Normal rate and regular rhythm.   Respiratory: Effort normal and breath sounds normal. No respiratory distress.  GI: Soft. Bowel sounds are normal. He exhibits no distension.  Neurological: He is alert and oriented to person, place, and time.  Follows commands. Good awareness of deficits. LUE 4/5 deltoid, biceps, triceps, wrist, hand. LLE: 3/5 HF and 3+ KE, 4- ADF/PF. Visual fields intact although he reports blurriness in the left hemisphere  Skin: Skin is warm and dry.  Psychiatric: He has a normal mood and affect. His behavior is normal. Judgment and thought content normal.    No results found for this or any previous visit (from the past 24 hour(s)). Mr Brain Wo Contrast  Result Date: 07/30/2016 CLINICAL DATA:  Initial evaluation for acute headache, left-sided weakness. Evaluate for stroke. Status post tPA. EXAM: MRI HEAD WITHOUT CONTRAST TECHNIQUE: Multiplanar, multiecho pulse sequences of the brain and surrounding structures were obtained without intravenous  contrast. COMPARISON:  None available. FINDINGS: Brain: Cerebral volume within normal limits for patient age. No focal parenchymal signal abnormality identified. No significant cerebral white matter disease. No abnormal foci of restricted diffusion to suggest acute or subacute ischemia. Gray-white matter differentiation is well maintained. No evidence for acute or chronic intracranial hemorrhage. No encephalomalacia of to suggest chronic infarction. No mass lesion, midline shift, or mass effect. No hydrocephalus. No extra-axial fluid collection. Major dural sinuses are patent. Pituitary gland normal. Vascular: Major intracranial vascular flow voids are well maintained. Skull and upper cervical spine: Craniocervical junction normal. Visualized upper cervical spine unremarkable. Bone marrow signal intensity within normal limits. No scalp soft tissue abnormality. Sinuses/Orbits: Globes and orbital soft tissues within normal limits. Moderate mucosal thickening throughout the paranasal sinuses. No air-fluid levels to suggest active sinus infection. No mastoid effusion. Inner ear structures normal.\ IMPRESSION: 1. Normal brain MRI. No acute intracranial infarct or other abnormality identified. 2. Moderate inflammatory/allergic paranasal sinus disease. Electronically Signed   By: Rise MuBenjamin  McClintock M.D.   On: 07/30/2016 18:45    Assessment/Plan: Diagnosis: Right subcortical infarct with left hemiparesis 1. Does the need for close, 24 hr/day medical supervision in concert with the patient's rehab needs make it unreasonable for this patient to be served in a less intensive setting? Potentially 2. Co-Morbidities requiring supervision/potential complications: post-stroke sequelae 3. Due to bladder management, bowel management, safety, skin/wound care, disease management, medication administration, pain management and patient education, does the patient require 24 hr/day rehab nursing? Potentially 4. Does the patient  require coordinated care of a physician, rehab nurse, PT (1-2 hrs/day, 5 days/week) and OT (1-2 hrs/day, 5 days/week) to address physical and functional deficits in the context of the above medical diagnosis(es)? Potentially Addressing deficits in the following areas: balance, endurance, locomotion, strength, transferring,  bowel/bladder control, bathing, dressing, feeding, grooming, toileting and psychosocial support 5. Can the patient actively participate in an intensive therapy program of at least 3 hrs of therapy per day at least 5 days per week? Yes 6. The potential for patient to make measurable gains while on inpatient rehab is good 7. Anticipated functional outcomes upon discharge from inpatient rehab are modified independent  with PT, modified independent with OT, n/a with SLP. 8. Estimated rehab length of stay to reach the above functional goals is: potentially 5-7 days 9. Does the patient have adequate social supports and living environment to accommodate these discharge functional goals? Yes 10. Anticipated D/C setting: Home 11. Anticipated post D/C treatments: HH therapy and Outpatient therapy 12. Overall Rehab/Functional Prognosis: excellent  RECOMMENDATIONS: This patient's condition is appropriate for continued rehabilitative care in the following setting: see below Patient has agreed to participate in recommended program. Potentially Note that insurance prior authorization may be required for reimbursement for recommended care.  Comment: Pt appears to be making neurological progress. Would like to see him mobilize again with therapy this morning. Might be far enough along where he could discharge with outpt therapies. If he hasn't progressed, a short inpatient rehab admission would be in order.   Ranelle Oyster, MD, [facility] Physical Medicine & Rehabilitation 07/31/2016    Charlton Amor., PA-C 07/31/2016

## 2016-07-31 NOTE — Progress Notes (Signed)
Physical Therapy Treatment Patient Details Name: Robert Velez MRN: [id number] DOB: 1976-01-16 Today's Date: 07/31/2016    History of Present Illness Patient is a 41 y/o male presents with LLE weakness and headache. [facility]. Scans not updated in computer yet.     PT Comments    Patient with improved strength from yesterday but continues to demonstrate left sided weakness compared to right side. Continues to exhibit left foot drop and left knee instability but able to perform gait training without UE support today. Did well with recurvatum wrap on LLE. Pt reports some memory deficits and recall issues. Tolerated stair training with supervision for safety and cues. Pt declines CIR as he really wants to return home to be with his 7517 month old child. Discharge recommendation updated to OPPT. RN and MD notified.    Follow Up Recommendations  Outpatient PT (pt declining CIR- wants to be at home)     Equipment Recommendations  None recommended by PT    Recommendations for Other Services       Precautions / Restrictions Precautions Precautions: Fall Restrictions Weight Bearing Restrictions: No    Mobility  Bed Mobility Overal bed mobility: Modified Independent Bed Mobility: Supine to Sit     Supine to sit: Modified independent (Device/Increase time);HOB elevated     General bed mobility comments: No assist needed.   Transfers Overall transfer level: Needs assistance Equipment used: None Transfers: Sit to/from Stand Sit to Stand: Modified independent (Device/Increase time)         General transfer comment: Stood from EOB x1, no knee buckling.  Ambulation/Gait Ambulation/Gait assistance: Min guard Ambulation Distance (Feet): 50 Feet (+ 50' +60') Assistive device: None Gait Pattern/deviations: Step-through pattern;Decreased stance time - left;Decreased step length - right;Trunk flexed;Steppage Gait velocity: decreased Gait velocity interpretation:  Below normal speed for age/gender General Gait Details: Continues to have left foot drop and left knee instability but improved from prior session. Donned recurvatem wrap on LLE- reports improvement with knee stability but slid off due to hair on legs; 2 seated rest breaks. HR 130 bpm.   Stairs Stairs: Yes   Stair Management: Step to pattern;Two rails Number of Stairs: 3 (+ 2 steps x2 bouts) General stair comments: Cues for technique and safety.   Wheelchair Mobility    Modified Rankin (Stroke Patients Only) Modified Rankin (Stroke Patients Only) Pre-Morbid Rankin Score: No symptoms Modified Rankin: Moderately severe disability     Balance Overall balance assessment: Needs assistance Sitting-balance support: Feet supported;No upper extremity supported Sitting balance-Leahy Scale: Good     Standing balance support: During functional activity Standing balance-Leahy Scale: Fair Standing balance comment: Able to stand without UE support.                    Cognition Arousal/Alertness: Awake/alert Behavior During Therapy: WFL for tasks assessed/performed Overall Cognitive Status: Impaired/Different from baseline Area of Impairment: Memory     Memory: Decreased short-term memory              Exercises Other Exercises Other Exercises: Sit to stand x13 for strengthening    General Comments General comments (skin integrity, edema, etc.): Family and wife present during session.       Pertinent Vitals/Pain Pain Assessment: No/denies pain    Home Living                      Prior Function            PT Goals (current  goals can now be found in the care plan section) Progress towards PT goals: Progressing toward goals    Frequency    Min 3X/week      PT Plan Discharge plan needs to be updated    Co-evaluation             End of Session Equipment Utilized During Treatment: Gait belt Activity Tolerance: Patient tolerated treatment  well Patient left: in bed;with call bell/phone within reach;with family/visitor present     Time: 1335-1400 PT Time Calculation (min) (ACUTE ONLY): 25 min  Charges:  $Gait Training: 23-37 mins                    G Codes:      Peyson Delao A Lillyan Hitson 07/31/2016, 2:30 PM Mylo Red, PT, DPT [phone]

## 2016-09-22 ENCOUNTER — Encounter: Payer: Self-pay | Admitting: Nurse Practitioner

## 2016-09-22 ENCOUNTER — Ambulatory Visit (INDEPENDENT_AMBULATORY_CARE_PROVIDER_SITE_OTHER): Payer: 59 | Admitting: Nurse Practitioner

## 2016-09-22 ENCOUNTER — Encounter (INDEPENDENT_AMBULATORY_CARE_PROVIDER_SITE_OTHER): Payer: Self-pay

## 2016-09-22 VITALS — BP 117/64 | HR 85 | Ht 71.0 in | Wt 287.6 lb

## 2016-09-22 DIAGNOSIS — R2990 Unspecified symptoms and signs involving the nervous system: Secondary | ICD-10-CM

## 2016-09-22 DIAGNOSIS — E785 Hyperlipidemia, unspecified: Secondary | ICD-10-CM | POA: Diagnosis not present

## 2016-09-22 DIAGNOSIS — I10 Essential (primary) hypertension: Secondary | ICD-10-CM

## 2016-09-22 DIAGNOSIS — R400 Somnolence: Secondary | ICD-10-CM

## 2016-09-22 DIAGNOSIS — I639 Cerebral infarction, unspecified: Secondary | ICD-10-CM | POA: Diagnosis not present

## 2016-09-22 HISTORY — DX: Somnolence: R40.0

## 2016-09-22 NOTE — Patient Instructions (Signed)
Stressed the importance of management of risk factors to prevent further stroke Continue Aspirin for secondary stroke prevention Maintain strict control of hypertension with blood pressure goal below 130/90, today's reading 117/64 Control of diabetes with hemoglobin A1c below 6.5 followed by primary care most recent hemoglobin A1c5.5 Cholesterol with LDL cholesterol less than 70, followed by primary care,  most recent 117  continue Crestor ESS score is elevated, will obtain sleep evaluation Exercise by walking, slowly increase , eat healthy diet with whole grains,  fresh fruits and vegetables I explained in particular the risks and ramifications of untreated moderate to severe OSA, especially with respect to cardiovascular disease  including congestive heart failure, difficult to treat hypertension, cardiac arrhythmias, or stroke. Even type 2 diabetes has, in part, been linked to untreated OSA. Symptoms of untreated OSA include daytime sleepiness, memory problems, mood irritability and mood disorder such as depression and anxiety, lack of energy, as well as recurrent headaches, especially morning headaches. We talked about trying to maintain a healthy lifestyle in general, as well as the importance of weight control. I encouraged the patient to eat healthy, exercise daily and keep well hydrated, to keep a scheduled bedtime and wake time routine, to not skip any meals and eat healthy snacks in between meals Discussed risk for recurrent stroke/ TIA and answered additional questions

## 2016-09-22 NOTE — Progress Notes (Addendum)
[name]  PATIENT: Robert Velez DOB: [birth year]   REASON FOR VISIT: hospital follow up stroke , new complaint of daytime drowsiness HISTORY FROM:patient and wife    HISTORY OF PRESENT ILLNESS: [name], 41 year old male returns for hospital follow-up he was seen [facility] January 31 with severe headache left arm and foot weakness. He also noted sensory changes numbness and tingling in the same distribution he was diagnosed with a stroke IV TPA was administered and then he was [facility]. His strokelike symptoms right brain likely secondary to small vessel disease CTA of the head and neck unremarkable. LDL 117 hemoglobin A1c 5.5 and intact thrombotic to admission started on aspirin he was also started on Lipitor for hyperlipidemia. 2-D echo 60-65% EF. He returns to the stroke clinic today for follow-up. He remains on aspirin for secondary stroke prevention without further stroke or TIA symptoms. In addition his Lipitor has been changed to Crestor by his primary care. Blood pressure is well controlled in the office today at 117/64. He does have a new complaint of daytime sleepiness, ESS score of 18. FSS 55. He does snore at night . He also has new complaint of headaches which he has not had previously. He returns for reevaluation REVIEW OF SYSTEMS: Full 14 system review of systems performed and notable only for those listed, all others are neg:  Constitutional: neg  Cardiovascular: neg Ear/Nose/Throat: neg  Skin: neg Eyes: neg Respiratory: neg Gastroitestinal: neg  Hematology/Lymphatic: neg  Endocrine: neg Musculoskeletal:neg Allergy/Immunology: neg Neurological: Headache Psychiatric: Decreased energy  Sleep : Snoring daytime drowsiness   ALLERGIES: No Known Allergies  HOME MEDICATIONS: Outpatient Medications Prior to Visit  Medication Sig Dispense Refill  . aspirin EC 81 MG EC tablet Take 1 tablet (81 mg total) by mouth daily.  30 tablet 2  . citalopram (CELEXA) 40 MG tablet Take 40 mg by mouth daily.    . Cyanocobalamin (VITAMIN B 12 PO) Take 1 tablet by mouth daily. 2500mcg once daily    . omeprazole (PRILOSEC) 20 MG capsule Take 20 mg by mouth daily.    Marland Kitchen. atorvastatin (LIPITOR) 40 MG tablet Take 1 tablet (40 mg total) by mouth daily at 6 PM. (Patient not taking: Reported on 09/22/2016) 30 tablet 2   No facility-administered medications prior to visit.     PAST MEDICAL HISTORY: History reviewed. No pertinent past medical history.  PAST SURGICAL HISTORY: Past Surgical History:  Procedure Laterality Date  . TYMPANOPLASTY WITH GRAFT Right    1998    FAMILY HISTORY: Family History  Problem Relation Age of Onset  . Cancer Father     at 2957y  . Cancer Maternal Grandmother   . Heart disease Maternal Grandfather     SOCIAL HISTORY: Social History   Social History  . Marital status: Married    Spouse name: N/A  . Number of children: N/A  . Years of education: N/A   Occupational History  . Not on file.   Social History Main Topics  . Smoking status: Never Smoker  . Smokeless tobacco: Former NeurosurgeonUser    Types: Snuff    Quit date: 07/29/2016  . Alcohol use No  . Drug use: No  . Sexual activity: Not on file   Other Topics Concern  . Not on file   Social History Narrative   Lives at home with wife and children.  Works for [employer]  Drinks sweet Tea.       PHYSICAL EXAM  Vitals:   09/22/16 1025  BP: 117/64  Pulse: 85  Weight: 287 lb 9.6 oz (130.5 kg)  Height: 5\' 11"  (1.803 m)   Body mass index is 40.11 kg/m.  Generalized: Well developed, Morbidly obese male in no acute distress  Head: normocephalic and atraumatic,. Oropharynx benign  Neck: Supple, no carotid bruits , neck size 17.5 Cardiac: Regular rate rhythm, no murmur  Musculoskeletal: No deformity   Neurological examination   Mentation: Alert oriented to time, place, history taking. Attention span and concentration  appropriate. Recent and remote memory intact.  Follows all commands speech and language fluent.   Cranial nerve II-XII: Fundoscopic exam reveals sharp disc margins.Pupils were equal round reactive to light extraocular movements were full, visual field were full on confrontational test. Facial sensation and strength were normal. hearing was intact to finger rubbing bilaterally. Uvula tongue midline. head turning and shoulder shrug were normal and symmetric.Tongue protrusion into cheek strength was normal. Motor: normal bulk and tone, full strength in the BUE, BLE, fine finger movements normal, no pronator drift. No focal weakness Sensory: normal and symmetric to light touch, pinprick, and  Vibration, in the upper and lower extremities  Coordination: finger-nose-finger, heel-to-shin bilaterally, no dysmetria Reflexes: 1+ upper lower and symmetric plantar responses were flexor bilaterally. Gait and Station: Rising up from seated position without assistance, normal stance,  moderate stride, good arm swing, smooth turning, able to perform tiptoe, and heel walking without difficulty. Tandem gait is steady  DIAGNOSTIC DATA (LABS, IMAGING, TESTING) - I reviewed patient records, labs, notes, testing and imaging myself where available.  Lab Results  Component Value Date   WBC 7.2 07/30/2016   HGB 15.3 07/30/2016   HCT 45.9 07/30/2016   MCV 85.3 07/30/2016   PLT 237 07/30/2016      Component Value Date/Time   NA 139 07/30/2016 0350   K 3.5 07/30/2016 0350   CL 106 07/30/2016 0350   CO2 24 07/30/2016 0350   GLUCOSE 116 (H) 07/30/2016 0350   BUN 11 07/30/2016 0350   CREATININE 1.00 07/30/2016 0350   CALCIUM 9.0 07/30/2016 0350   PROT 6.9 07/30/2016 0350   ALBUMIN 4.0 07/30/2016 0350   AST 27 07/30/2016 0350   ALT 30 07/30/2016 0350   ALKPHOS 86 07/30/2016 0350   BILITOT 0.9 07/30/2016 0350   GFRNONAA >60 07/30/2016 0350   GFRAA >60 07/30/2016 0350   Lab Results  Component Value Date    CHOL 176 07/30/2016   HDL 33 (L) 07/30/2016   LDLCALC 117 (H) 07/30/2016   TRIG 131 07/30/2016   CHOLHDL 5.3 07/30/2016   Lab Results  Component Value Date   HGBA1C 5.5 07/30/2016    ASSESSMENT AND PLAN  41 y.o. year old male  with hospital admission for stroke like symptoms left-sided weakness. IV TPA was given at [facility] and transferred to [facility]. History of hypertension and hyperlipidemia, returns to the stroke clinic for follow-up. The patient is a current patient of Dr. Roda Shutters  who is out of the office today . This note is sent to the work in doctor.        PLAN: Stressed the importance of management of risk factors to prevent further stroke Continue Aspirin for secondary stroke prevention Maintain strict control of hypertension with blood pressure goal below 130/90, today's reading 117/64 Control of diabetes with hemoglobin A1c below 6.5 followed by primary care most recent hemoglobin A1c5.5 Cholesterol with LDL cholesterol less than 70, followed by primary care,  most recent 117  continue Crestor ESS score is elevated, will obtain sleep evaluation Exercise by walking, slowly increase , eat healthy diet with whole grains,  fresh fruits and vegetables I explained in particular the risks and ramifications of untreated moderate to severe OSA, especially with respect to cardiovascular disease  including congestive heart failure, difficult to treat hypertension, cardiac arrhythmias, or stroke. Even type 2 diabetes has, in part, been linked to untreated OSA. Symptoms of untreated OSA include daytime sleepiness, memory problems, mood irritability and mood disorder such as depression and anxiety, lack of energy, as well as recurrent headaches, especially morning headaches. We talked about trying to maintain a healthy lifestyle in general, as well as the importance of weight control. I encouraged the patient to eat healthy, exercise daily and keep well hydrated, to keep a scheduled bedtime  and wake time routine, to not skip any meals and eat healthy snacks in between meals Discussed risk for recurrent stroke/ TIA and answered additional questions This was a  visit requiring 30 minutes and medical decision making of high complexity with extensive review of history, hospital chart, counseling and answering questions Nilda Riggs, [facility], [facility], APRN  [facility] [address], [address] [place], [place] [address] [phone]  I reviewed the above note and documentation by the Nurse Practitioner and agree with the history, physical exam, assessment and plan as outlined above. I was immediately available for face-to-face consultation. Huston Foley, MD, PhD [name])

## 2016-11-02 ENCOUNTER — Encounter (INDEPENDENT_AMBULATORY_CARE_PROVIDER_SITE_OTHER): Payer: Self-pay

## 2016-11-02 ENCOUNTER — Ambulatory Visit (INDEPENDENT_AMBULATORY_CARE_PROVIDER_SITE_OTHER): Payer: Managed Care, Other (non HMO) | Admitting: Neurology

## 2016-11-02 ENCOUNTER — Encounter: Payer: Self-pay | Admitting: Neurology

## 2016-11-02 VITALS — BP 135/92 | HR 85 | Resp 20 | Ht 71.0 in | Wt 286.0 lb

## 2016-11-02 DIAGNOSIS — R0683 Snoring: Secondary | ICD-10-CM

## 2016-11-02 DIAGNOSIS — G4719 Other hypersomnia: Secondary | ICD-10-CM

## 2016-11-02 NOTE — Progress Notes (Signed)
[name]  PATIENT: Robert Velez DOB: 06/04/1976   REASON FOR VISIT: hospital follow up stroke , new complaint of daytime drowsiness HISTORY FROM:patient and wife   Interval history from 11/02/2016. Robert Velez a 41 year old married Caucasian male patient presents today to the sleep clinic. His primary care physician is Dr. Sherral Hammers at [facility] family practice in [place]. The patient suffered a stroke July 29 2016 ,but was lucky to get to the hospital in time for TPA, and was transferred from there to Dr Roda Shutters on the  stroke service. In his first follow-up at [facility] as an outpatient he mentions that he had become more sleepy and fatigued. The sleepiness peaked around the time of his last visit with [place] but has slightly improved since. He endorsed an Epworth sleepiness score of 18 points and fatigue score of 55 points, his wife confirmed that he snores at night and he does have nocturnal headaches or wakes up with headaches frequently.   The patient is usually in bed by 10:00 and falls asleep promptly, the bedroom is described as cool, quiet and dark. He sleeps on his side and usually wakes up still being on his side. He sleeps on only one pillow. He has one bathroom break at night and rises in the morning usually spontaneously at 5:30 AM. He estimates that she sleeps 6-1/2-7 hours at night. He feels that his sleep is restorative and refreshing has neither vivid dreams nor nightmares. He is not a sleep walker. The patient takes sometimes naps in daytime.  The patient works as an Art gallery manager often outside with exposure to natural daylight. He reports that he gained about 50 pounds of weight over the last 2 years, he does not attribute this to changes in medication or illnesses nor does he have any injuries preventing him from exercising.     HISTORY OF PRESENT ILLNESS: Robert Velez, 41 year old male returns for hospital follow-up he was seen at [facility] January 31 with severe headache left arm and foot weakness. He also noted sensory changes numbness and tingling in the same distribution he was diagnosed with a stroke IV TPA was administered and then he was transferred to [facility]. His strokelike symptoms right brain likely secondary to small vessel disease CTA of the head and neck unremarkable. LDL 117 hemoglobin A1c 5.5 and intact thrombotic to admission started on aspirin he was also started on Lipitor for hyperlipidemia. 2-D echo 60-65% EF. He returns to the stroke clinic today for follow-up. He remains on aspirin for secondary stroke prevention without further stroke or TIA symptoms. In addition his Lipitor has been changed to Crestor by his primary care. Blood pressure is well controlled in the office today at 117/64. He does have a new complaint of daytime sleepiness, ESS score of 18. FSS 55. He does snore at night . He also has new complaint of headaches which he has not had previously. He returns for reevaluation REVIEW OF SYSTEMS: Full 14 system review of systems performed and notable only for those listed, all others are neg:  Neurological: Headache Psychiatric: Decreased energy  Sleep : Snoring daytime drowsiness   ALLERGIES: No Known Allergies  HOME MEDICATIONS: Outpatient Medications Prior to Visit  Medication Sig Dispense Refill  . aspirin EC 81 MG EC tablet Take 1 tablet (81 mg total) by mouth daily. 30 tablet 2  . citalopram (CELEXA) 40 MG tablet Take 40 mg by mouth daily.    . Cyanocobalamin (VITAMIN B 12 PO)  Take 1 tablet by mouth daily. once daily    . omeprazole (PRILOSEC) 20 MG capsule Take 20 mg by mouth daily.    . rosuvastatin (CRESTOR) 10 MG tablet Take 10 mg by mouth daily.     No facility-administered medications prior to visit.     PAST MEDICAL HISTORY: No past medical history on file.  PAST SURGICAL HISTORY: Past Surgical History:  Procedure Laterality Date  . TYMPANOPLASTY WITH GRAFT  Right    1998    FAMILY HISTORY: Family History  Problem Relation Age of Onset  . Cancer Father     at 20y  . Cancer Maternal Grandmother   . Heart disease Maternal Grandfather     SOCIAL HISTORY: Social History   Social History  . Marital status: Married    Spouse name: N/A  . Number of children: N/A  . Years of education: N/A   Occupational History  . Not on file.   Social History Main Topics  . Smoking status: Never Smoker  . Smokeless tobacco: Former Neurosurgeon    Types: Snuff    Quit date: 07/29/2016  . Alcohol use No  . Drug use: No  . Sexual activity: Not on file   Other Topics Concern  . Not on file   Social History Narrative   Lives at home with wife and children.  Works for [employer].  Drinks sweet Tea.       PHYSICAL EXAM  Vitals:   11/02/16 0850  BP: (!) 135/92  Pulse: 85  Resp: 20  Weight: 286 lb (129.7 kg)  Height: 5\' 11"  (1.803 m)   Body mass index is 39.89 kg/m.  Generalized: Well developed, Morbidly obese male in no acute distress  Head: normocephalic and atraumatic,. Oropharynx benign  Neck: Supple, no carotid bruits , neck size 18.5 Cardiac: Regular rate rhythm, no murmur  Musculoskeletal: No deformity   Neurological examination   Mentation: Alert oriented to time, place, history taking. Attention span and concentration appropriate. Recent and remote memory intact.  Follows all commands speech and language fluent.   Cranial nerve: sense of smell or taste is preserved. Pupils were equal round reactive to light extraocular movements were full, visual field were full on confrontational test. Facial sensation and strength were normal. hearing was intact to finger rubbing bilaterally. His uvula is midline , Mallampati is 4, his head turning and shoulder shrug were normal and symmetric.Tongue protrusion into cheek strength was normal. Motor: normal bulk and tone, full strength in the extremities without  pronator drift. No clumsiness. He  reports a feeling of tiredness in the left arm/ leg - he feels heavier. No pronator drift  .  Sensory: normal and symmetric to light touch, pinprick, and  Vibration, in the upper and lower extremities  Coordination: finger-nose-finger,  no dysmetria Reflexes: 1+ upper lower and symmetric plantar responses were flexor bilaterally. Gait and Station: Rising up from seated position without assistance, normal stance,  smooth turning with 3 steps.  DIAGNOSTIC DATA (LABS, IMAGING, TESTING) - I reviewed patient records, labs, notes, testing and imaging myself where available.  Lab Results  Component Value Date   WBC 7.2 07/30/2016   HGB 15.3 07/30/2016   HCT 45.9 07/30/2016   MCV 85.3 07/30/2016   PLT 237 07/30/2016      Component Value Date/Time   NA 139 07/30/2016 0350   K 3.5 07/30/2016 0350   CL 106 07/30/2016 0350   CO2 24 07/30/2016 0350   GLUCOSE 116 (H)  07/30/2016 0350   BUN 11 07/30/2016 0350   CREATININE 1.00 07/30/2016 0350   CALCIUM 9.0 07/30/2016 0350   PROT 6.9 07/30/2016 0350   ALBUMIN 4.0 07/30/2016 0350   AST 27 07/30/2016 0350   ALT 30 07/30/2016 0350   ALKPHOS 86 07/30/2016 0350   BILITOT 0.9 07/30/2016 0350   GFRNONAA >60 07/30/2016 0350   GFRAA >60 07/30/2016 0350   Lab Results  Component Value Date   CHOL 176 07/30/2016   HDL 33 (L) 07/30/2016   LDLCALC 117 (H) 07/30/2016   TRIG 131 07/30/2016   CHOLHDL 5.3 07/30/2016   Lab Results  Component Value Date   HGBA1C 5.5 07/30/2016    ASSESSMENT AND PLAN  41 y.o. year old male  with hospital admission for stroke like symptoms left-sided weakness. Within 75 minutes  [facility] and the patiant was [facility] [place].   Risk factors of hypertension and hyperlipidemia, obesity , and [facility]  [facility] after hisstroke clinic for follow-up. The patient is a current patient of Dr. Roda ShuttersXu.   PLAN: Stressed the importance of management of risk  factors to prevent further stroke-  one Of his possible risk factors is obstructive sleep apnea as he does present with a high-grade Mallampati, neck circumference of 18-1/2 inches, and a body mass index of 40. I will order a sleep study for the patient, I would prefer a split-night polysomnography that would allow treatment in the same night as diagnosis.. The patient has returned to his full-time employment Monday through Friday, he would prefer a Friday night test.  These recommendations were from Dr Roda ShuttersXu and NP Daphine DeutscherMartin' as follows:  "Continue Aspirin for secondary stroke prevention Maintain strict control of hypertension with blood pressure goal below 130/90, today's reading 117/64 Cholesterol with LDL cholesterol less than 70, followed by primary care,  most recent 117 - he discontinued Lipitor/ Crestor, now on non-statin. Exercise by walking, slowly increase , eat healthy diet with whole grains,  fresh fruits and vegetables I explained in particular the risks and ramifications of untreated moderate to severe OSA, especially with respect to cardiovascular disease  including congestive heart failure, difficult to treat hypertension, cardiac arrhythmias, or stroke. Even type 2 diabetes has, in part, been linked to untreated OSA. Symptoms of untreated OSA include daytime sleepiness, memory problems, mood irritability and mood disorder such as depression and anxiety, lack of energy, as well as recurrent headaches, especially morning headaches. We talked about trying to maintain a healthy lifestyle in general, as well as the importance of weight control. I encouraged the patient to eat healthy, exercise daily and keep well hydrated, to keep a scheduled bedtime and wake time routine, to not skip any meals and eat healthy snacks in between meals Discussed risk for recurrent stroke/ TIA and answered additional questions."  This was a  visit requiring 30 minutes and medical decision making of high complexity  with extensive review of history, hospital chart, counseling and answering questions  SPLIT night polysomnography.  Melvyn Novasarmen Sagan Wurzel, MD  Medical Director of [facility].  [facility] [address], [address] [place], [place] [address] [phone] [name] ([facility])

## 2016-12-24 ENCOUNTER — Ambulatory Visit: Payer: 59 | Admitting: Nurse Practitioner

## 2017-01-06 ENCOUNTER — Encounter (INDEPENDENT_AMBULATORY_CARE_PROVIDER_SITE_OTHER): Payer: Managed Care, Other (non HMO) | Admitting: Neurology

## 2017-01-06 DIAGNOSIS — G4710 Hypersomnia, unspecified: Secondary | ICD-10-CM | POA: Diagnosis not present

## 2017-01-13 ENCOUNTER — Other Ambulatory Visit: Payer: Self-pay | Admitting: Neurology

## 2017-01-13 DIAGNOSIS — I63431 Cerebral infarction due to embolism of right posterior cerebral artery: Secondary | ICD-10-CM

## 2017-01-13 DIAGNOSIS — G4733 Obstructive sleep apnea (adult) (pediatric): Secondary | ICD-10-CM

## 2017-01-13 NOTE — Procedures (Signed)
[facility] [address] [place], [place] [address] NAME: Robert Velez   DOB: October 17, 1975 MEDICAL RECORD No: [id number]  DOS: 01/06/17 REFERRING PHYSICIAN: Darrol Angelarolyn Martin, NP STUDY PERFORMED: HST HISTORY: [name], 41 year old male returns for hospital follow-up after he was [facility] with severe headaches, left arm and foot weakness. He was diagnosed with a stroke, and IV TPA was administered. He does have a new complaint of daytime sleepiness, ESS score of 18. FSS 55. BMI is 40. He does snore at night. He also has new complaint of headaches which he has not had previously. He returns for sleep evaluation. STUDY RESULTS: Total Recording Time: 8h 6371m Total Apnea/Hypopnea Index (AHI): 13.8/hr. Average Oxygen Saturation: SpO2 93%, Lowest Oxygen Saturation: 83% and total desaturation time was 8 minutes. Average Heart Rate: 83 bpm ( 69 -113) IMPRESSION: Mild obstructive sleep apnea without desaturation, associated with snoring.  RECOMMENDATION: This type of sleep apnea can respond to a dental device, weight loss and can be treated with CPAP. I recommend CPAP in light of the severe sleepiness and recent stroke. I will order an auto-titration 5-12 cm water with a mask of patient's choice.  I certify that I have reviewed the raw data recording prior to the issuance of this report in accordance with the standards of the American Academy of Sleep Medicine (AASM). Melvyn Novasarmen Jahzeel Poythress, MD    01-13-2017 [facility] Medical Director Diplomat, ABPN and ABSM  AASM accredited facility

## 2017-01-13 NOTE — Addendum Note (Signed)
Addended by: Melvyn NovasHMEIER, Arty Lantzy on: 01/13/2017 05:52 PM   Modules accepted: Orders

## 2017-01-14 ENCOUNTER — Telehealth: Payer: Self-pay | Admitting: Neurology

## 2017-01-14 NOTE — Telephone Encounter (Signed)
-----   Message from Melvyn Novasarmen Dohmeier, MD sent at 01/13/2017  5:52 PM EDT ----- Mild sleep apnea, moderate snoring- I recommend  CPAP in light of the severe sleepiness and recent stroke. I will  order an auto-titration CPAP with heated humidity between 5-12 cm water, and  with a mask of patient's  choice. CD  [facility] Stroke team, Darrol AngelCarolyn Martin.  Dr [facility]ral [facility]obbins [facility], [place]

## 2017-01-14 NOTE — Telephone Encounter (Signed)
I called pt. I advised pt that Dr. Vickey Hugerohmeier reviewed their sleep study results and found that pt mild sleep apnea with no low oxygenation. Dr. Vickey Hugerohmeier recommends that pt start CPAP due to his severe sleepiness and recent stroke. I reviewed PAP compliance expectations with the pt. Pt is agreeable to starting a CPAP. I advised pt that an order will be sent to a DME, [facility], and [facility] will call the pt within about one week after they file with the pt's insurance. [facility] will show the pt how to use the machine, fit for masks, and troubleshoot the CPAP if needed. A follow up appt was made for insurance purposes with Dr. Vickey Hugerohmeier on [place], Apr 13 2017 at 10:00am. Pt verbalized understanding to arrive 15 minutes early and bring their CPAP. A letter with all of this information in it will be mailed to the pt as a reminder. I verified with the pt that the address we have on file is correct. Pt verbalized understanding of results. Pt had no questions at this time but was encouraged to call back if questions arise.

## 2017-01-19 DIAGNOSIS — H6591 Unspecified nonsuppurative otitis media, right ear: Secondary | ICD-10-CM

## 2017-01-19 HISTORY — DX: Unspecified nonsuppurative otitis media, right ear: H65.91

## 2017-01-27 ENCOUNTER — Telehealth: Payer: Self-pay | Admitting: Neurology

## 2017-01-27 NOTE — Telephone Encounter (Signed)
I received this e-mail regarding pt insurance. This was only insurance I had on file. I attempted reaching out to the pt and it went straight to voicemail. LVM for pt to call back.  "I received [employer] as this patients insurance but that policy expired in April. Do you have any updated insurance information? I have been leaving him Voicemails to try to get that information from him but have been unsuccessful in making contact."

## 2017-04-13 ENCOUNTER — Ambulatory Visit: Payer: Self-pay | Admitting: Neurology

## 2017-04-14 ENCOUNTER — Encounter: Payer: Self-pay | Admitting: Neurology

## 2017-07-15 IMAGING — MR MR HEAD W/O CM
9 of 11 series · 35 of 48 positions shown · non-contrast
Comparison: None available.

CLINICAL DATA: Initial evaluation for acute headache, left-sided
weakness. Evaluate for stroke. Status post tPA.

EXAM:
MRI HEAD WITHOUT CONTRAST
TECHNIQUE: Multiplanar, multiecho pulse sequences of the brain and surrounding
structures were obtained without intravenous contrast.

[Series 3: DWI · axial · 3.0mm · 0.94mm/px · z∈[-66,+63]mm · 9 of 88 slices shown (1 of 2)]
[im 1/88]
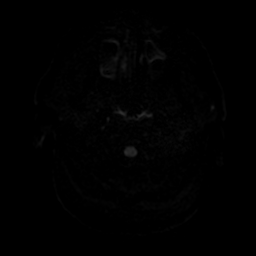
[im 11/88]
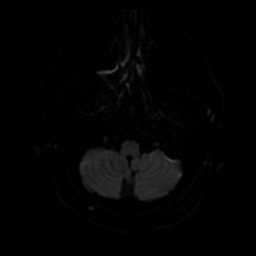
[im 22/88]
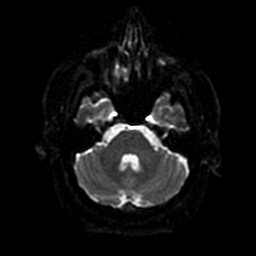
[im 33/88]
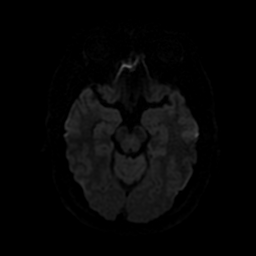
[im 44/88]
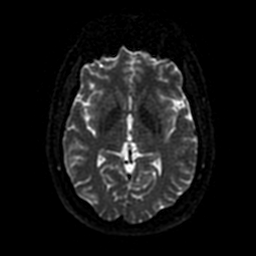
[im 55/88]
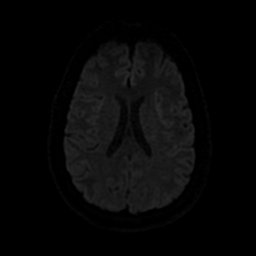
[im 66/88]
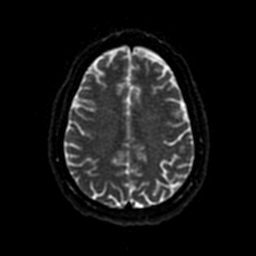
[im 77/88]
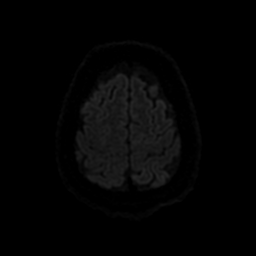
[im 88/88]
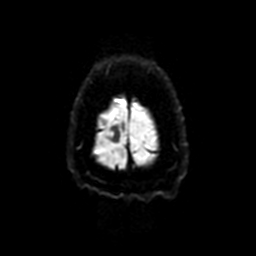

[Series 4: DWI · coronal · 4.0mm · 0.94mm/px · 7 of 68 slices shown (2 of 2)]
[im 1/68]
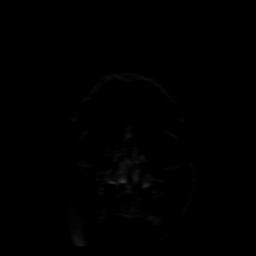
[im 12/68]
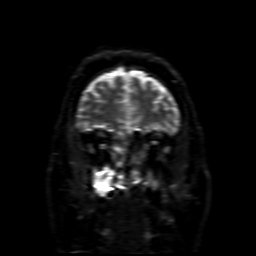
[im 23/68]
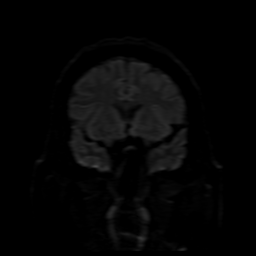
[im 34/68]
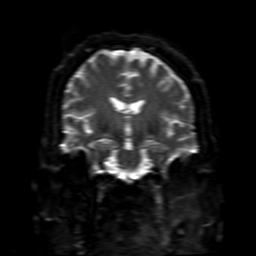
[im 45/68]
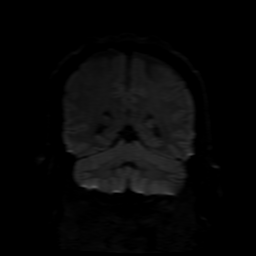
[im 56/68]
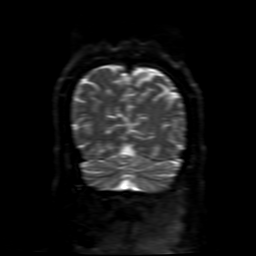
[im 68/68]
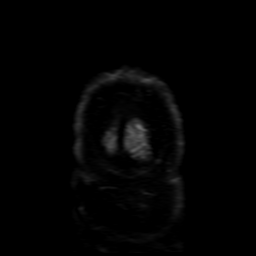

[Series 6: T2 · axial · 5.0mm · 0.47mm/px · z∈[-65,+61]mm · 2 of 22 slices shown (1 of 3)]
[im 1/22]
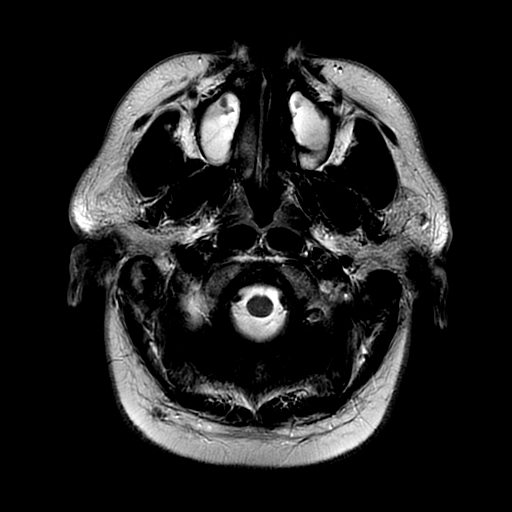
[im 22/22]
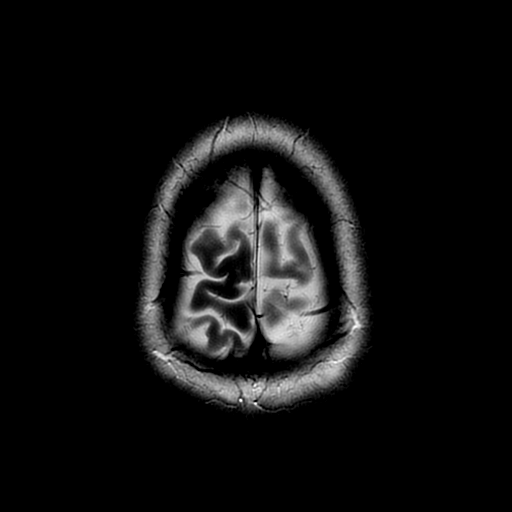

[Series 8: FLAIR · axial · 5.0mm · 0.47mm/px · z∈[-71,+61]mm · 2 of 23 slices shown (1 of 2)]
[im 1/23]
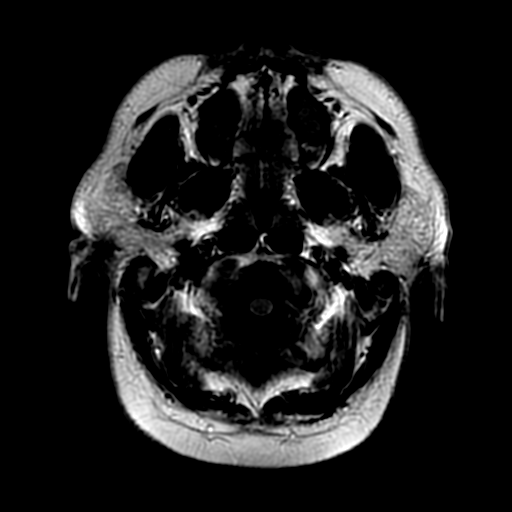
[im 23/23]
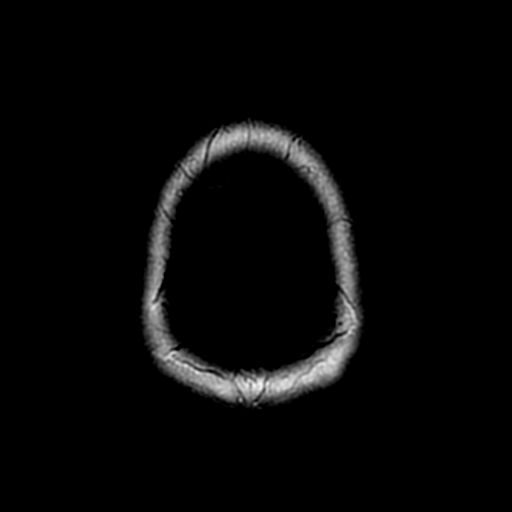

[Series 9: FLAIR · sagittal · 5.0mm · 0.51mm/px · 2 of 23 slices shown (2 of 2)]
[im 1/23]
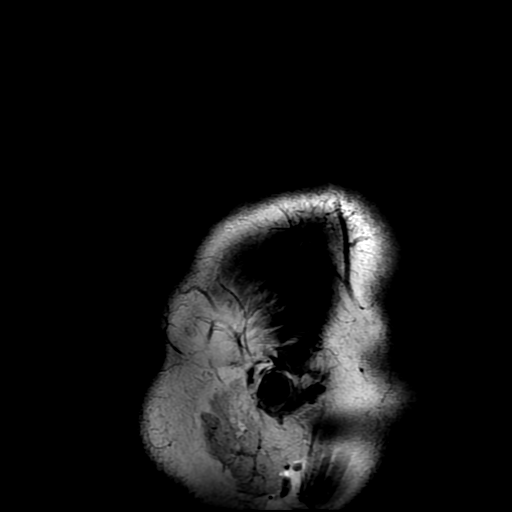
[im 23/23]
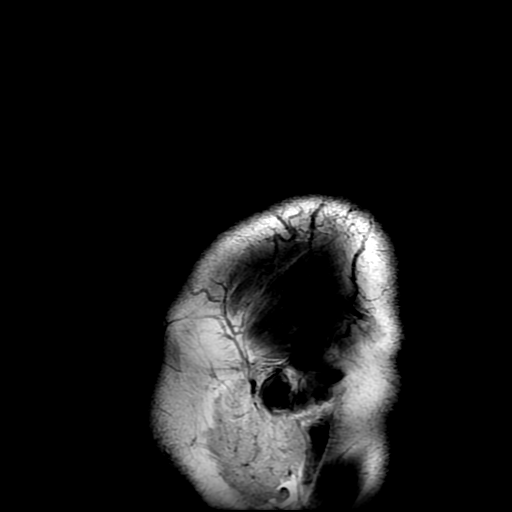

[Series 10: T2 · coronal · 5.0mm · 0.39mm/px · 3 of 28 slices shown (2 of 3)]
[im 1/28]
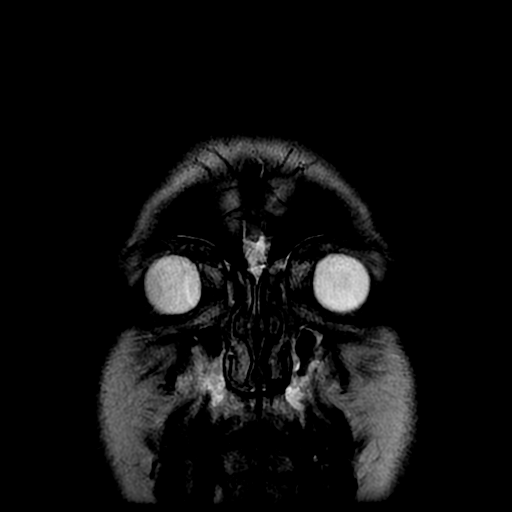
[im 14/28]
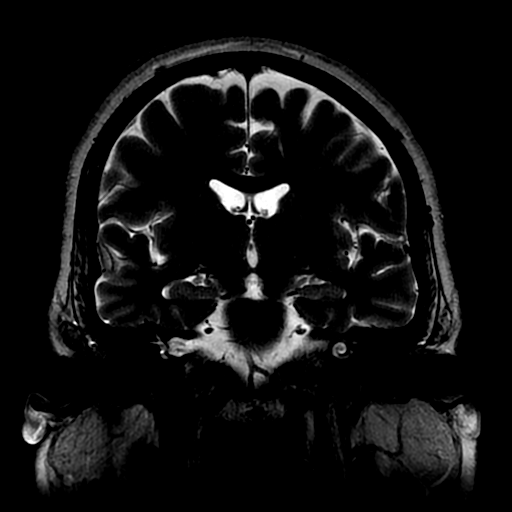
[im 28/28]
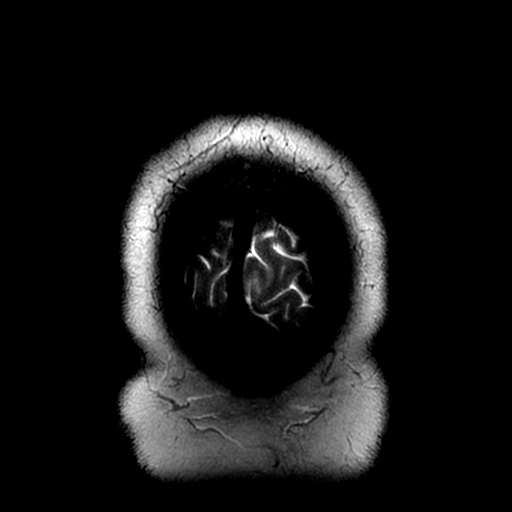

[Series 11: T2 · coronal · 5.0mm · 0.39mm/px · 3 of 28 slices shown (3 of 3)]
[im 1/28]
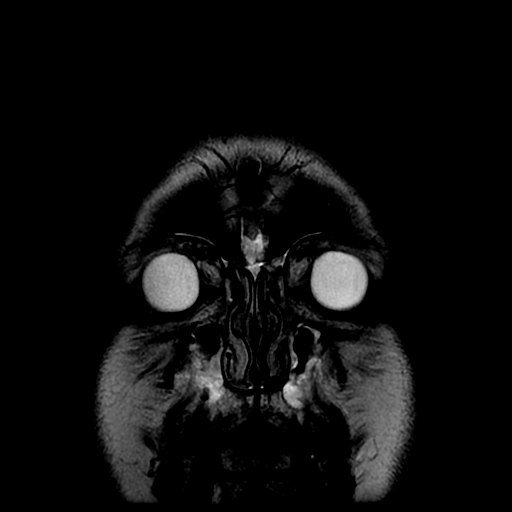
[im 14/28]
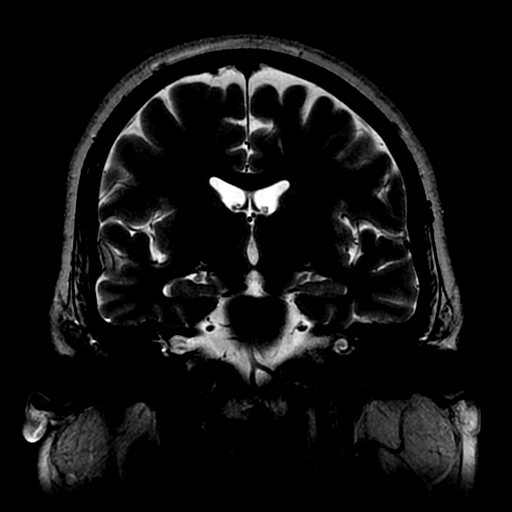
[im 28/28]
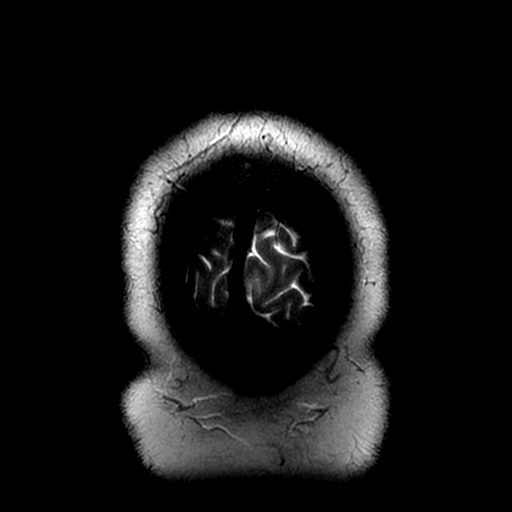

[Series 350: ADC · axial · 3.0mm · 0.94mm/px · z∈[-66,+63]mm · 4 of 43 slices shown (1 of 2)]
[im 1/43]
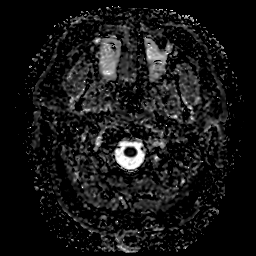
[im 15/43]
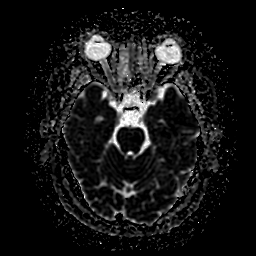
[im 29/43]
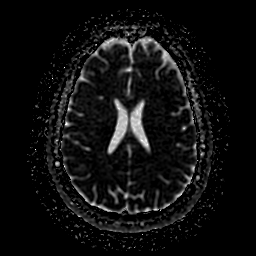
[im 43/43]
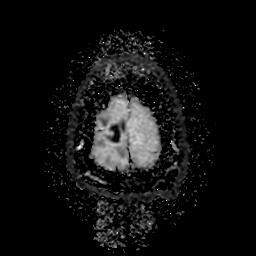

[Series 450: ADC · coronal · 4.0mm · 0.94mm/px · 3 of 34 slices shown (2 of 2)]
[im 1/34]
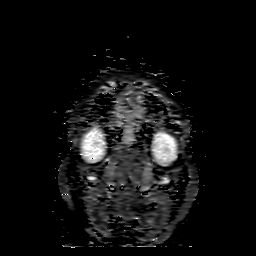
[im 17/34]
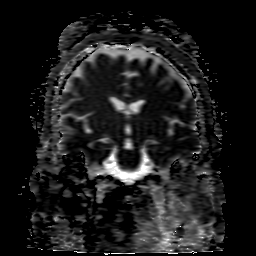
[im 34/34]
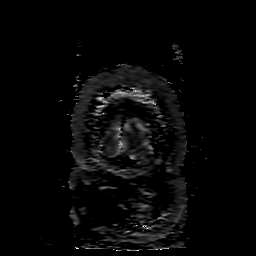

[35 of 48 positions shown; findings below may reference images not displayed]

FINDINGS: Brain: Cerebral volume within normal limits for patient age. No
focal parenchymal signal abnormality identified. No significant
cerebral white matter disease.

No abnormal foci of restricted diffusion to suggest acute or
subacute ischemia. Gray-white matter differentiation is well
maintained. No evidence for acute or chronic intracranial
hemorrhage. No encephalomalacia of to suggest chronic infarction.

No mass lesion, midline shift, or mass effect. No hydrocephalus. No
extra-axial fluid collection. Major dural sinuses are patent.

Pituitary gland normal.

Vascular: Major intracranial vascular flow voids are well
maintained.

Skull and upper cervical spine: Craniocervical junction normal.
Visualized upper cervical spine unremarkable. Bone marrow signal
intensity within normal limits. No scalp soft tissue abnormality.

Sinuses/Orbits: Globes and orbital soft tissues within normal
limits.

Moderate mucosal thickening throughout the paranasal sinuses. No
air-fluid levels to suggest active sinus infection. No mastoid
effusion. Inner ear structures normal.
IMPRESSION: 1. Normal brain MRI. No acute intracranial infarct or other
abnormality identified.
2. Moderate inflammatory/allergic paranasal sinus disease.

## 2017-07-16 DIAGNOSIS — R4586 Emotional lability: Secondary | ICD-10-CM

## 2017-07-16 DIAGNOSIS — M129 Arthropathy, unspecified: Secondary | ICD-10-CM

## 2017-07-16 DIAGNOSIS — E782 Mixed hyperlipidemia: Secondary | ICD-10-CM

## 2017-07-16 DIAGNOSIS — I639 Cerebral infarction, unspecified: Secondary | ICD-10-CM

## 2017-07-16 HISTORY — DX: Mixed hyperlipidemia: E78.2

## 2017-07-16 HISTORY — DX: Arthropathy, unspecified: M12.9

## 2017-07-16 HISTORY — DX: Cerebral infarction, unspecified: I63.9

## 2017-07-16 HISTORY — DX: Emotional lability: R45.86

## 2019-07-26 DIAGNOSIS — B351 Tinea unguium: Secondary | ICD-10-CM

## 2019-07-26 DIAGNOSIS — M25521 Pain in right elbow: Secondary | ICD-10-CM

## 2019-07-26 HISTORY — DX: Tinea unguium: B35.1

## 2019-07-26 HISTORY — DX: Pain in right elbow: M25.521

## 2019-07-27 DIAGNOSIS — M7711 Lateral epicondylitis, right elbow: Secondary | ICD-10-CM

## 2019-07-27 HISTORY — DX: Lateral epicondylitis, right elbow: M77.11

## 2019-11-29 DIAGNOSIS — R079 Chest pain, unspecified: Secondary | ICD-10-CM | POA: Diagnosis not present

## 2019-11-30 DIAGNOSIS — R9430 Abnormal result of cardiovascular function study, unspecified: Secondary | ICD-10-CM

## 2019-11-30 DIAGNOSIS — I10 Essential (primary) hypertension: Secondary | ICD-10-CM | POA: Diagnosis not present

## 2019-11-30 DIAGNOSIS — R739 Hyperglycemia, unspecified: Secondary | ICD-10-CM | POA: Diagnosis not present

## 2019-11-30 DIAGNOSIS — E785 Hyperlipidemia, unspecified: Secondary | ICD-10-CM

## 2019-11-30 DIAGNOSIS — R079 Chest pain, unspecified: Secondary | ICD-10-CM

## 2019-11-30 DIAGNOSIS — I200 Unstable angina: Secondary | ICD-10-CM

## 2019-12-01 ENCOUNTER — Encounter (HOSPITAL_COMMUNITY)
Admission: AD | Disposition: A | Discharge status: Home / Self Care | Payer: Self-pay | Source: Other Acute Inpatient Hospital | Attending: Internal Medicine

## 2019-12-01 ENCOUNTER — Observation Stay (HOSPITAL_COMMUNITY)
Admission: AD | Admit: 2019-12-01 | Discharge: 2019-12-02 | Disposition: A | Discharge status: Home / Self Care | Payer: Managed Care, Other (non HMO) | Source: Other Acute Inpatient Hospital | Attending: Internal Medicine | Admitting: Internal Medicine

## 2019-12-01 ENCOUNTER — Encounter (HOSPITAL_COMMUNITY): Payer: Self-pay | Admitting: Family Medicine

## 2019-12-01 DIAGNOSIS — I69354 Hemiplegia and hemiparesis following cerebral infarction affecting left non-dominant side: Secondary | ICD-10-CM | POA: Insufficient documentation

## 2019-12-01 DIAGNOSIS — Z79899 Other long term (current) drug therapy: Secondary | ICD-10-CM | POA: Diagnosis not present

## 2019-12-01 DIAGNOSIS — Z87891 Personal history of nicotine dependence: Secondary | ICD-10-CM | POA: Insufficient documentation

## 2019-12-01 DIAGNOSIS — Z7982 Long term (current) use of aspirin: Secondary | ICD-10-CM | POA: Insufficient documentation

## 2019-12-01 DIAGNOSIS — R9439 Abnormal result of other cardiovascular function study: Secondary | ICD-10-CM

## 2019-12-01 DIAGNOSIS — I10 Essential (primary) hypertension: Secondary | ICD-10-CM | POA: Diagnosis not present

## 2019-12-01 DIAGNOSIS — Z6837 Body mass index (BMI) 37.0-37.9, adult: Secondary | ICD-10-CM | POA: Diagnosis not present

## 2019-12-01 DIAGNOSIS — K219 Gastro-esophageal reflux disease without esophagitis: Secondary | ICD-10-CM | POA: Diagnosis not present

## 2019-12-01 DIAGNOSIS — E119 Type 2 diabetes mellitus without complications: Secondary | ICD-10-CM | POA: Insufficient documentation

## 2019-12-01 DIAGNOSIS — R079 Chest pain, unspecified: Secondary | ICD-10-CM

## 2019-12-01 DIAGNOSIS — E785 Hyperlipidemia, unspecified: Secondary | ICD-10-CM | POA: Insufficient documentation

## 2019-12-01 DIAGNOSIS — F419 Anxiety disorder, unspecified: Secondary | ICD-10-CM | POA: Insufficient documentation

## 2019-12-01 DIAGNOSIS — E669 Obesity, unspecified: Secondary | ICD-10-CM | POA: Insufficient documentation

## 2019-12-01 DIAGNOSIS — G4733 Obstructive sleep apnea (adult) (pediatric): Secondary | ICD-10-CM | POA: Diagnosis not present

## 2019-12-01 DIAGNOSIS — R2990 Unspecified symptoms and signs involving the nervous system: Secondary | ICD-10-CM | POA: Diagnosis not present

## 2019-12-01 HISTORY — DX: Essential (primary) hypertension: I10

## 2019-12-01 HISTORY — DX: Hyperlipidemia, unspecified: E78.5

## 2019-12-01 HISTORY — PX: LEFT HEART CATH AND CORONARY ANGIOGRAPHY: CATH118249

## 2019-12-01 HISTORY — DX: Anemia, unspecified: D64.9

## 2019-12-01 HISTORY — DX: Chest pain, unspecified: R07.9

## 2019-12-01 HISTORY — DX: Abnormal result of other cardiovascular function study: R94.39

## 2019-12-01 LAB — CBC
HCT: 48.4 % (ref 39.0–52.0)
Hemoglobin: 15.9 g/dL (ref 13.0–17.0)
MCH: 28.5 pg (ref 26.0–34.0)
MCHC: 32.9 g/dL (ref 30.0–36.0)
MCV: 86.9 fL (ref 80.0–100.0)
Platelets: 267 10*3/uL (ref 150–400)
RBC: 5.57 MIL/uL (ref 4.22–5.81)
RDW: 13.1 % (ref 11.5–15.5)
WBC: 7.7 10*3/uL (ref 4.0–10.5)
nRBC: 0 % (ref 0.0–0.2)

## 2019-12-01 LAB — BASIC METABOLIC PANEL
Anion gap: 8 (ref 5–15)
BUN: 13 mg/dL (ref 6–20)
CO2: 24 mmol/L (ref 22–32)
Calcium: 8.7 mg/dL — ABNORMAL LOW (ref 8.9–10.3)
Chloride: 102 mmol/L (ref 98–111)
Creatinine, Ser: 0.93 mg/dL (ref 0.61–1.24)
GFR calc Af Amer: >60 mL/min (ref >60)
GFR calc non Af Amer: >60 mL/min (ref >60)
Glucose, Bld: 100 mg/dL — ABNORMAL HIGH (ref 70–99)
Potassium: 4.3 mmol/L (ref 3.5–5.1)
Sodium: 134 mmol/L — ABNORMAL LOW (ref 135–145)

## 2019-12-01 LAB — HIV ANTIBODY (ROUTINE TESTING W REFLEX): HIV Screen 4th Generation wRfx: NONREACTIVE

## 2019-12-01 LAB — HEPARIN LEVEL (UNFRACTIONATED): Heparin Unfractionated: 0.26 IU/mL — ABNORMAL LOW (ref 0.30–0.70)

## 2019-12-01 SURGERY — LEFT HEART CATH AND CORONARY ANGIOGRAPHY
Anesthesia: LOCAL

## 2019-12-01 MED ORDER — ASPIRIN 81 MG PO CHEW: 81.0000 mg | CHEWABLE_TABLET | ORAL | Status: DC

## 2019-12-01 MED ORDER — HYDROCODONE-ACETAMINOPHEN 5-325 MG PO TABS
1.0000 | ORAL_TABLET | ORAL | Status: DC | PRN
  Administered 2019-12-01 (×2): 2 via ORAL
  Filled 2019-12-01 (×2): qty 2

## 2019-12-01 MED ORDER — IOHEXOL 350 MG/ML SOLN
INTRAVENOUS | Status: DC | PRN
  Administered 2019-12-01: 80 mL

## 2019-12-01 MED ORDER — SODIUM CHLORIDE 0.9% FLUSH: 3.0000 mL | INTRAVENOUS | Status: DC | PRN

## 2019-12-01 MED ORDER — VERAPAMIL HCL 2.5 MG/ML IV SOLN
INTRAVENOUS | Status: DC | PRN
  Administered 2019-12-01: 10 mL via INTRA_ARTERIAL

## 2019-12-01 MED ORDER — ATORVASTATIN CALCIUM 40 MG PO TABS
40.0000 mg | ORAL_TABLET | Freq: Every day | ORAL | Status: DC
  Administered 2019-12-01: 40 mg via ORAL
  Filled 2019-12-01: qty 1

## 2019-12-01 MED ORDER — AMLODIPINE BESYLATE 5 MG PO TABS: 5.0000 mg | ORAL_TABLET | Freq: Every day | ORAL | 0 refills | Status: DC

## 2019-12-01 MED ORDER — FENTANYL CITRATE (PF) 100 MCG/2ML IJ SOLN
INTRAMUSCULAR | Status: DC | PRN
  Administered 2019-12-01 (×2): 25 ug via INTRAVENOUS

## 2019-12-01 MED ORDER — HYDRALAZINE HCL 20 MG/ML IJ SOLN: 10.0000 mg | INTRAMUSCULAR | Status: AC | PRN

## 2019-12-01 MED ORDER — NITROGLYCERIN 0.4 MG SL SUBL: 0.4000 mg | SUBLINGUAL_TABLET | SUBLINGUAL | Status: DC | PRN

## 2019-12-01 MED ORDER — VERAPAMIL HCL 2.5 MG/ML IV SOLN
INTRAVENOUS | Status: AC
  Filled 2019-12-01: qty 2

## 2019-12-01 MED ORDER — SODIUM CHLORIDE 0.9 % WEIGHT BASED INFUSION: 1.0000 mL/kg/h | INTRAVENOUS | Status: DC

## 2019-12-01 MED ORDER — ACETAMINOPHEN 325 MG PO TABS
650.0000 mg | ORAL_TABLET | ORAL | Status: DC | PRN
  Filled 2019-12-01: qty 2

## 2019-12-01 MED ORDER — MIDAZOLAM HCL 2 MG/2ML IJ SOLN
INTRAMUSCULAR | Status: AC
  Filled 2019-12-01: qty 2

## 2019-12-01 MED ORDER — SODIUM CHLORIDE 0.9% FLUSH: 3.0000 mL | Freq: Two times a day (BID) | INTRAVENOUS | Status: DC

## 2019-12-01 MED ORDER — HEPARIN (PORCINE) IN NACL 1000-0.9 UT/500ML-% IV SOLN
INTRAVENOUS | Status: DC | PRN
  Administered 2019-12-01: 500 mL

## 2019-12-01 MED ORDER — ASPIRIN 81 MG PO CHEW
81.0000 mg | CHEWABLE_TABLET | Freq: Every day | ORAL | Status: DC
  Administered 2019-12-01 – 2019-12-02 (×2): 81 mg via ORAL
  Filled 2019-12-01 (×2): qty 1

## 2019-12-01 MED ORDER — LIDOCAINE HCL (PF) 1 % IJ SOLN
INTRAMUSCULAR | Status: DC | PRN
  Administered 2019-12-01: 3 mL

## 2019-12-01 MED ORDER — HEPARIN SODIUM (PORCINE) 1000 UNIT/ML IJ SOLN
INTRAMUSCULAR | Status: DC | PRN
  Administered 2019-12-01: 6000 [IU] via INTRAVENOUS

## 2019-12-01 MED ORDER — SODIUM CHLORIDE 0.9% FLUSH
3.0000 mL | Freq: Two times a day (BID) | INTRAVENOUS | Status: DC
  Administered 2019-12-01 – 2019-12-02 (×3): 3 mL via INTRAVENOUS

## 2019-12-01 MED ORDER — HEPARIN (PORCINE) IN NACL 1000-0.9 UT/500ML-% IV SOLN
INTRAVENOUS | Status: AC
  Filled 2019-12-01: qty 1000

## 2019-12-01 MED ORDER — AMLODIPINE BESYLATE 5 MG PO TABS
5.0000 mg | ORAL_TABLET | Freq: Every day | ORAL | Status: DC
  Administered 2019-12-01 – 2019-12-02 (×2): 5 mg via ORAL
  Filled 2019-12-01 (×2): qty 1

## 2019-12-01 MED ORDER — SODIUM CHLORIDE 0.9 % IV SOLN: 250.0000 mL | INTRAVENOUS | Status: DC | PRN

## 2019-12-01 MED ORDER — SODIUM CHLORIDE 0.9 % WEIGHT BASED INFUSION
3.0000 mL/kg/h | INTRAVENOUS | Status: DC
  Administered 2019-12-01: 3 mL/kg/h via INTRAVENOUS

## 2019-12-01 MED ORDER — LABETALOL HCL 5 MG/ML IV SOLN: 10.0000 mg | INTRAVENOUS | Status: AC | PRN

## 2019-12-01 MED ORDER — HEPARIN (PORCINE) 25000 UT/250ML-% IV SOLN
1550.0000 [IU]/h | INTRAVENOUS | Status: DC
Start: 2019-12-01 — End: 2019-12-01
  Filled 2019-12-01: qty 250

## 2019-12-01 MED ORDER — HEPARIN SODIUM (PORCINE) 1000 UNIT/ML IJ SOLN
INTRAMUSCULAR | Status: AC
  Filled 2019-12-01: qty 1

## 2019-12-01 MED ORDER — KETOROLAC TROMETHAMINE 30 MG/ML IJ SOLN
30.0000 mg | Freq: Once | INTRAMUSCULAR | Status: AC
  Administered 2019-12-01: 30 mg via INTRAVENOUS
  Filled 2019-12-01: qty 1

## 2019-12-01 MED ORDER — MORPHINE SULFATE (PF) 4 MG/ML IV SOLN
4.0000 mg | INTRAVENOUS | Status: DC | PRN
  Administered 2019-12-01: 4 mg via INTRAVENOUS
  Filled 2019-12-01: qty 1

## 2019-12-01 MED ORDER — FENTANYL CITRATE (PF) 100 MCG/2ML IJ SOLN
INTRAMUSCULAR | Status: AC
  Filled 2019-12-01: qty 2

## 2019-12-01 MED ORDER — SODIUM CHLORIDE 0.9 % IV SOLN: INTRAVENOUS | Status: AC

## 2019-12-01 MED ORDER — ONDANSETRON HCL 4 MG/2ML IJ SOLN
4.0000 mg | Freq: Four times a day (QID) | INTRAMUSCULAR | Status: DC | PRN
  Administered 2019-12-01: 4 mg via INTRAVENOUS
  Filled 2019-12-01: qty 2

## 2019-12-01 MED ORDER — MIDAZOLAM HCL 2 MG/2ML IJ SOLN
INTRAMUSCULAR | Status: DC | PRN
  Administered 2019-12-01 (×2): 1 mg via INTRAVENOUS

## 2019-12-01 MED ORDER — LIDOCAINE HCL (PF) 1 % IJ SOLN
INTRAMUSCULAR | Status: AC
  Filled 2019-12-01: qty 30

## 2019-12-01 SURGICAL SUPPLY — 10 items
CATH 5FR JL3.5 JR4 ANG PIG MP (CATHETERS) ×1 IMPLANT
DEVICE RAD COMP TR BAND LRG (VASCULAR PRODUCTS) ×1 IMPLANT
GLIDESHEATH SLEND SS 6F .021 (SHEATH) ×1 IMPLANT
GUIDEWIRE INQWIRE 1.5J.035X260 (WIRE) IMPLANT
INQWIRE 1.5J .035X260CM (WIRE) ×2
KIT HEART LEFT (KITS) ×2 IMPLANT
PACK CARDIAC CATHETERIZATION (CUSTOM PROCEDURE TRAY) ×2 IMPLANT
SYR MEDRAD MARK 7 150ML (SYRINGE) ×2 IMPLANT
TRANSDUCER W/STOPCOCK (MISCELLANEOUS) ×2 IMPLANT
TUBING CIL FLEX 10 FLL-RA (TUBING) ×2 IMPLANT

## 2019-12-01 NOTE — Progress Notes (Signed)
Patient is still c/o headache and N/V; vomited x 1.  Zofran and Norco/Vicodin given at 17:11& 17:38 were not effective.. Pain remained 10/10. Morphine given at 2031 was somewhat effective; pain down 5/10. On call Blount X., text paged with the above information and made aware that patient is pending discharge. Still waiting for response. Discharge orders have been placed.

## 2019-12-01 NOTE — H&P (View-Only) (Signed)
Cardiology Consultation:   Patient ID: Robert Velez; [id number]; [birth year]   Admit date: 12/01/2019 Date of Consult: 12/01/2019  Primary Care Provider: [facility] Primary Cardiologist: Berniece Salines, DO  Seen by Dr. Jenne Campus  Primary Electrophysiologist:  None   Patient Profile:   Robert Velez is a 44 y.o. male with a hx of CVA 07/29/2016 rx w/ TPA, mild OSA w/ CPAP recommended, DM, HTN, HLD, anemia, GERD, obesity, who is being seen today for the evaluation of abnl stress test at the request of Dr Algis Liming.  History of Present Illness:   Robert Velez was seen by his PCP 06/02 for CP and SOB that had started 3 days ago. Pt sent to ED at [facility] and admitted.  Cardiac enzymes were negative for MI, however, a Lexiscan Myoview had abnormalities and he was transferred to [facility] to be evaluated for cath.  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  He has no history of exertional chest pain.    He had onset of substernal/left chest pain 3 days prior to his admission.  It was intermittent, heaviness, and would reach a 10 out of 10.  Episodes would last about 10 minutes but they were recurrent.  Episodes would occur at rest and with exertion.  He may have been a little short of breath with it, but no nausea, vomiting or diaphoresis.  He has never had anything like this before. Deep inspiration seemed to make it a little worse.  In the hospital, he did not have any symptoms.  He was given aspirin, but did not require nitrates.  Cardiac enzymes were negative.  During the [place], he developed chest pain at an 8/10 and did get sublingual nitroglycerin x2 which helped the pain.  He was also hypertensive and given amlodipine 5 mg.  His chest pain was prolonged, but eventually resolved.  The images showed a moderate region of moderate decreased counts within the apical and mid segment of the anterior septal wall.  EF  59%.  Of note, diabetes is listed on his diagnoses, but he denies any history of this.  The only hemoglobin A1c in the computer, including Care Everywhere, is 5.5.   Past Medical History:  Diagnosis Date  . Anemia   . Diabetes mellitus without complication ([place])   . Hyperlipidemia   . Hypertension   . Stroke ([place]) 07/29/2016   Received TPA at an outside hospital    Past Surgical History:  Procedure Laterality Date  . TYMPANOPLASTY WITH GRAFT Right    1998     Prior to Admission medications   Medication Sig Start Date End Date Taking? Authorizing Provider  aspirin EC 81 MG EC tablet Take 1 tablet (81 mg total) by mouth daily. 08/01/16  Yes Donzetta Starch, NP  citalopram (CELEXA) 40 MG tablet Take 40 mg by mouth daily.   Yes [provider]  omeprazole (PRILOSEC) 20 MG capsule Take 20 mg by mouth daily.   Yes [provider]  celecoxib (CELEBREX) 200 MG capsule Take 200 mg by mouth 2 (two) times daily. 11/22/19   [provider]    Inpatient Medications: Scheduled Meds: . amLODipine  5 mg Oral Daily  . aspirin  81 mg Oral Daily  . atorvastatin  40 mg Oral q1800   Continuous Infusions: . heparin 1,550 Units/hr (12/01/19 0725)   PRN Meds: acetaminophen, HYDROcodone-acetaminophen, morphine injection, nitroGLYCERIN, ondansetron (ZOFRAN) IV  Allergies:  Allergies  Allergen Reactions  . Sulfamethoxazole Rash    Social History:   Social History   Socioeconomic History  . Marital status: Married    Spouse name: Not on file  . Number of children: Not on file  . Years of education: Not on file  . Highest education level: Not on file  Occupational History  . Occupation: Sports administrator  Tobacco Use  . Smoking status: Never Smoker  . Smokeless tobacco: Former Neurosurgeon    Types: Snuff  Substance and Sexual Activity  . Alcohol use: No  . Drug use: No  . Sexual activity: Yes  Other Topics Concern  . Not on file  Social History Narrative    Lives at home with wife and children.  Lives in [place].  Drinks sweet Tea.     Social Determinants of Health   Financial Resource Strain:   . Difficulty of Paying Living Expenses:   Food Insecurity:   . Worried About Programme researcher, broadcasting/film/video in the Last Year:   . Barista in the Last Year:   Transportation Needs:   . Freight forwarder (Medical):   Marland Kitchen Lack of Transportation (Non-Medical):   Physical Activity:   . Days of Exercise per Week:   . Minutes of Exercise per Session:   Stress:   . Feeling of Stress :   Social Connections:   . Frequency of Communication with Friends and Family:   . Frequency of Social Gatherings with Friends and Family:   . Attends Religious Services:   . Active Member of Clubs or Organizations:   . Attends Banker Meetings:   Marland Kitchen Marital Status:   Intimate Partner Violence:   . Fear of Current or Ex-Partner:   . Emotionally Abused:   Marland Kitchen Physically Abused:   . Sexually Abused:     Family History:   Family History  Problem Relation Age of Onset  . Cancer Father        at 54y  . Cancer Maternal Grandmother   . Heart disease Maternal Grandfather    Family Status:  Family Status  Relation Name Status  . Mother  Alive  . Father  Deceased  . MGM  Deceased  . MGF  Deceased    ROS:  Please see the history of present illness.  All other ROS reviewed and negative.     Physical Exam/Data:   Vitals:   12/01/19 0100 12/01/19 0403  BP: 122/80 123/88  Pulse: 75 70  Resp: 18 17  Temp: 98.6 F (37 C) 98.2 F (36.8 C)  TempSrc: Oral Oral  SpO2: 96% 97%  Weight: 123.5 kg   Height: 5\' 11"  (1.803 m)     Intake/Output Summary (Last 24 hours) at 12/01/2019 1156 Last data filed at 12/01/2019 1000 Gross per 24 hour  Intake 240 ml  Output 675 ml  Net -435 ml    Last 3 Weights 12/01/2019 11/02/2016 09/22/2016  Weight (lbs) 272 lb 3.2 oz 286 lb 287 lb 9.6 oz  Weight (kg) 123.469 kg 129.729 kg 130.455 kg     Body mass  index is 37.96 kg/m.   General:  Well nourished, well developed, male in no acute distress HEENT: normal Lymph: no adenopathy Neck: JVD -not elevated but difficult to assess secondary to body habitus Endocrine:  No thryomegaly Vascular: No carotid bruits; 4/4 extremity pulses 2+  Cardiac:  normal S1, S2; RRR; no murmur Lungs:  clear bilaterally, no wheezing, rhonchi or  rales  Abd: soft, nontender, no hepatomegaly  Ext: no edema Musculoskeletal:  No deformities, BUE and BLE strength normal and equal Skin: warm and dry  Neuro:  CNs 2-12 intact, no focal abnormalities noted Psych:  Normal affect   EKG:  The EKG was personally reviewed and demonstrates: 6/4 sinus rhythm, heart rate 74, no acute ischemic changes and no pathologic Q waves Telemetry:  Telemetry was personally reviewed and demonstrates: Sinus rhythm   CV studies:   ECHO: 2018 - Procedure narrative: Transthoracic echocardiography. Image  quality was suboptimal. The study was technically difficult, as a  result of poor sound wave transmission and body habitus.  - Left ventricle: The cavity size was normal. Wall thickness was  normal. Systolic function was normal. The estimated ejection  fraction was in the range of 60% to 65%. Wall motion was normal;  there were no regional wall motion abnormalities.  - Aortic valve: There was trivial regurgitation.   Impressions:   - Normal LV systolic function; probable mild diastolic dysfunction;  mildly calcified aortic valve with trace AI; cannot exclude  oscillating density; suggest TEE to further assess.   MYOVIEW: (At [facility]): 11/29/2019 Moderate size region of moderate decreased counts within the apical and mid segment of the anterior septal wall, which improves from stress to rest imaging.  No additional foci of ischemia are present, infarction identified.  Normal left ventricular wall motion, no left ventricular dilatation, EF 59%  CATH:  Ordered   Laboratory Data:   Chemistry Recent Labs  Lab 12/01/19 0451  NA 134*  K 4.3  CL 102  CO2 24  GLUCOSE 100*  BUN 13  CREATININE 0.93  CALCIUM 8.7*  GFRNONAA >60  GFRAA >60  ANIONGAP 8    Lab Results  Component Value Date   ALT 30 07/30/2016   AST 27 07/30/2016   ALKPHOS 86 07/30/2016   BILITOT 0.9 07/30/2016   Hematology Recent Labs  Lab 12/01/19 0451  WBC 7.7  RBC 5.57  HGB 15.9  HCT 48.4  MCV 86.9  MCH 28.5  MCHC 32.9  RDW 13.1  PLT 267   Cardiac Enzymes High Sensitivity Troponin: Negative x3  BNPNo results for input(s): BNP, PROBNP in the last 168 hours.  DDimer No results for input(s): DDIMER in the last 168 hours. TSH: No results found for: TSH Lipids: Lab Results  Component Value Date   CHOL 185  11/30/2019   HDL  not visible on the report     LDLCALC 113 (H)     VLDL  28    Triglycerides  382    HgbA1c: Lab Results  Component Value Date   HGBA1C 5.5 07/30/2016   Magnesium: No results found for: MG   Radiology/Studies:  No results found.  Assessment and Plan:   1.  Chest pain, high risk for cardiac etiology -His symptoms are not typical, but he has had recurrent chest pain at rest. -His stress test is significantly abnormal. -Cardiac catheterization was discussed with the patient fully. The patient understands that risks include but are not limited to stroke (1 in 1000), death (1 in 1000), kidney failure [usually temporary] (1 in 500), bleeding (1 in 200), allergic reaction [possibly serious] (1 in 200).  The patient understands and is willing to proceed.    2.  Dyslipidemia: -His goal LDL is now less than 70, discuss statin with MD  3.  Hypertension: - he was started on amlodipine 5 mg daily at [place] -However, with CAD, would  prefer him to be on a beta-blocker. -Discuss changing amlodipine to metoprolol or carvedilol with MD  Otherwise, per IM Principal Problem:   Chest pain Active Problems:   Stroke-like episode  (HCC) s/p tPA   Essential hypertension   Anxiety   Abnormal stress test     For questions or updates, please contact [facility] Please consult [email or web address] for contact info under Cardiology/STEMI.   Signed, Rhonda Barrett, PA-C  12/01/2019 11:56 AM  History and all data above reviewed.  Patient examined.  I agree with the findings as above.  The patient developed chest pain several days ago.  Has not had this type of pain before.  It comes and goes.  It happens at rest.  He cannot bring it on with activities.  He was going up to his left arm.  He finally presented when the pain was becoming more intense and frequent.  It would last for several minutes at a time.  He told his primary provider about it was sent to the hospital.  He has results of testing as above.  Is never had any prior cardiac history.  With the patient exam reveals COR:RRR, no rub  ,  Lungs: Clear  ,  Abd: Positive bowel sounds, no rebound no guarding, Ext No edema  .  All available labs, radiology testing, previous records reviewed. Agree with documented assessment and he finally presented to   chest pain:  Abnormal stress test.  Cath as above.    Dyslipidemia:  Goals of therapy will be based on there results of the cath.  I will agree with moderate dose statin for now.   HTN:  BP is at target.  Continue current therapy.      Chest pain: Abnormal perfusion study.  Given this cardiac catheterization is indicated.  Risks and benefits have been explained and he agrees to proceed.  Fatigue: He complains of this infrequent daytime somnolence but he does not snore and has had a negative sleep study.  He says this is really been going on since Covid.  He is not anemic.  I will defer further work-up to his primary provider.  HTN: Blood pressure has been elevated and started on amlodipine.  He also needs weight loss and exercise and we did discuss this.  Robert Velez  12:42 PM  12/01/2019  

## 2019-12-01 NOTE — Consult Note (Addendum)
Cardiology Consultation:   Patient ID: Robert Velez; [id number]; [birth year]   Admit date: 12/01/2019 Date of Consult: 12/01/2019  Primary Care Provider: [facility] Primary Cardiologist: Berniece Salines, DO  Seen by Dr. Jenne Campus  Primary Electrophysiologist:  None   Patient Profile:   Robert Velez is a 44 y.o. male with a hx of CVA 07/29/2016 rx w/ TPA, mild OSA w/ CPAP recommended, DM, HTN, HLD, anemia, GERD, obesity, who is being seen today for the evaluation of abnl stress test at the request of Dr Algis Liming.  History of Present Illness:   Robert Velez was seen by his PCP 06/02 for CP and SOB that had started 3 days ago. Pt sent to ED at [facility] and admitted.  Cardiac enzymes were negative for MI, however, a Lexiscan Myoview had abnormalities and he was transferred to [facility] to be evaluated for cath.  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  He has no history of exertional chest pain.    He had onset of substernal/left chest pain 3 days prior to his admission.  It was intermittent, heaviness, and would reach a 10 out of 10.  Episodes would last about 10 minutes but they were recurrent.  Episodes would occur at rest and with exertion.  He may have been a little short of breath with it, but no nausea, vomiting or diaphoresis.  He has never had anything like this before. Deep inspiration seemed to make it a little worse.  In the hospital, he did not have any symptoms.  He was given aspirin, but did not require nitrates.  Cardiac enzymes were negative.  During the [place], he developed chest pain at an 8/10 and did get sublingual nitroglycerin x2 which helped the pain.  He was also hypertensive and given amlodipine 5 mg.  His chest pain was prolonged, but eventually resolved.  The images showed a moderate region of moderate decreased counts within the apical and mid segment of the anterior septal wall.  EF  59%.  Of note, diabetes is listed on his diagnoses, but he denies any history of this.  The only hemoglobin A1c in the computer, including Care Everywhere, is 5.5.   Past Medical History:  Diagnosis Date  . Anemia   . Diabetes mellitus without complication ([place])   . Hyperlipidemia   . Hypertension   . Stroke ([place]) 07/29/2016   Received TPA at an outside hospital    Past Surgical History:  Procedure Laterality Date  . TYMPANOPLASTY WITH GRAFT Right    1998     Prior to Admission medications   Medication Sig Start Date End Date Taking? Authorizing Provider  aspirin EC 81 MG EC tablet Take 1 tablet (81 mg total) by mouth daily. 08/01/16  Yes Donzetta Starch, NP  citalopram (CELEXA) 40 MG tablet Take 40 mg by mouth daily.   Yes [provider]  omeprazole (PRILOSEC) 20 MG capsule Take 20 mg by mouth daily.   Yes [provider]  celecoxib (CELEBREX) 200 MG capsule Take 200 mg by mouth 2 (two) times daily. 11/22/19   [provider]    Inpatient Medications: Scheduled Meds: . amLODipine  5 mg Oral Daily  . aspirin  81 mg Oral Daily  . atorvastatin  40 mg Oral q1800   Continuous Infusions: . heparin 1,550 Units/hr (12/01/19 0725)   PRN Meds: acetaminophen, HYDROcodone-acetaminophen, morphine injection, nitroGLYCERIN, ondansetron (ZOFRAN) IV  Allergies:  Allergies  Allergen Reactions  . Sulfamethoxazole Rash    Social History:   Social History   Socioeconomic History  . Marital status: Married    Spouse name: Not on file  . Number of children: Not on file  . Years of education: Not on file  . Highest education level: Not on file  Occupational History  . Occupation: Sports administrator  Tobacco Use  . Smoking status: Never Smoker  . Smokeless tobacco: Former Neurosurgeon    Types: Snuff  Substance and Sexual Activity  . Alcohol use: No  . Drug use: No  . Sexual activity: Yes  Other Topics Concern  . Not on file  Social History Narrative    Lives at home with wife and children.  Lives in [place].  Drinks sweet Tea.     Social Determinants of Health   Financial Resource Strain:   . Difficulty of Paying Living Expenses:   Food Insecurity:   . Worried About Programme researcher, broadcasting/film/video in the Last Year:   . Barista in the Last Year:   Transportation Needs:   . Freight forwarder (Medical):   Marland Kitchen Lack of Transportation (Non-Medical):   Physical Activity:   . Days of Exercise per Week:   . Minutes of Exercise per Session:   Stress:   . Feeling of Stress :   Social Connections:   . Frequency of Communication with Friends and Family:   . Frequency of Social Gatherings with Friends and Family:   . Attends Religious Services:   . Active Member of Clubs or Organizations:   . Attends Banker Meetings:   Marland Kitchen Marital Status:   Intimate Partner Violence:   . Fear of Current or Ex-Partner:   . Emotionally Abused:   Marland Kitchen Physically Abused:   . Sexually Abused:     Family History:   Family History  Problem Relation Age of Onset  . Cancer Father        at 54y  . Cancer Maternal Grandmother   . Heart disease Maternal Grandfather    Family Status:  Family Status  Relation Name Status  . Mother  Alive  . Father  Deceased  . MGM  Deceased  . MGF  Deceased    ROS:  Please see the history of present illness.  All other ROS reviewed and negative.     Physical Exam/Data:   Vitals:   12/01/19 0100 12/01/19 0403  BP: 122/80 123/88  Pulse: 75 70  Resp: 18 17  Temp: 98.6 F (37 C) 98.2 F (36.8 C)  TempSrc: Oral Oral  SpO2: 96% 97%  Weight: 123.5 kg   Height: 5\' 11"  (1.803 m)     Intake/Output Summary (Last 24 hours) at 12/01/2019 1156 Last data filed at 12/01/2019 1000 Gross per 24 hour  Intake 240 ml  Output 675 ml  Net -435 ml    Last 3 Weights 12/01/2019 11/02/2016 09/22/2016  Weight (lbs) 272 lb 3.2 oz 286 lb 287 lb 9.6 oz  Weight (kg) 123.469 kg 129.729 kg 130.455 kg     Body mass  index is 37.96 kg/m.   General:  Well nourished, well developed, male in no acute distress HEENT: normal Lymph: no adenopathy Neck: JVD -not elevated but difficult to assess secondary to body habitus Endocrine:  No thryomegaly Vascular: No carotid bruits; 4/4 extremity pulses 2+  Cardiac:  normal S1, S2; RRR; no murmur Lungs:  clear bilaterally, no wheezing, rhonchi or  rales  Abd: soft, nontender, no hepatomegaly  Ext: no edema Musculoskeletal:  No deformities, BUE and BLE strength normal and equal Skin: warm and dry  Neuro:  CNs 2-12 intact, no focal abnormalities noted Psych:  Normal affect   EKG:  The EKG was personally reviewed and demonstrates: 6/4 sinus rhythm, heart rate 74, no acute ischemic changes and no pathologic Q waves Telemetry:  Telemetry was personally reviewed and demonstrates: Sinus rhythm   CV studies:   ECHO: 2018 - Procedure narrative: Transthoracic echocardiography. Image  quality was suboptimal. The study was technically difficult, as a  result of poor sound wave transmission and body habitus.  - Left ventricle: The cavity size was normal. Wall thickness was  normal. Systolic function was normal. The estimated ejection  fraction was in the range of 60% to 65%. Wall motion was normal;  there were no regional wall motion abnormalities.  - Aortic valve: There was trivial regurgitation.   Impressions:   - Normal LV systolic function; probable mild diastolic dysfunction;  mildly calcified aortic valve with trace AI; cannot exclude  oscillating density; suggest TEE to further assess.   MYOVIEW: (At [facility]): 11/29/2019 Moderate size region of moderate decreased counts within the apical and mid segment of the anterior septal wall, which improves from stress to rest imaging.  No additional foci of ischemia are present, infarction identified.  Normal left ventricular wall motion, no left ventricular dilatation, EF 59%  CATH:  Ordered   Laboratory Data:   Chemistry Recent Labs  Lab 12/01/19 0451  NA 134*  K 4.3  CL 102  CO2 24  GLUCOSE 100*  BUN 13  CREATININE 0.93  CALCIUM 8.7*  GFRNONAA >60  GFRAA >60  ANIONGAP 8    Lab Results  Component Value Date   ALT 30 07/30/2016   AST 27 07/30/2016   ALKPHOS 86 07/30/2016   BILITOT 0.9 07/30/2016   Hematology Recent Labs  Lab 12/01/19 0451  WBC 7.7  RBC 5.57  HGB 15.9  HCT 48.4  MCV 86.9  MCH 28.5  MCHC 32.9  RDW 13.1  PLT 267   Cardiac Enzymes High Sensitivity Troponin: Negative x3  BNPNo results for input(s): BNP, PROBNP in the last 168 hours.  DDimer No results for input(s): DDIMER in the last 168 hours. TSH: No results found for: TSH Lipids: Lab Results  Component Value Date   CHOL 185  11/30/2019   HDL  not visible on the report     LDLCALC 113 (H)     VLDL  28    Triglycerides  382    HgbA1c: Lab Results  Component Value Date   HGBA1C 5.5 07/30/2016   Magnesium: No results found for: MG   Radiology/Studies:  No results found.  Assessment and Plan:   1.  Chest pain, high risk for cardiac etiology -His symptoms are not typical, but he has had recurrent chest pain at rest. -His stress test is significantly abnormal. -Cardiac catheterization was discussed with the patient fully. The patient understands that risks include but are not limited to stroke (1 in 1000), death (1 in 1000), kidney failure [usually temporary] (1 in 500), bleeding (1 in 200), allergic reaction [possibly serious] (1 in 200).  The patient understands and is willing to proceed.    2.  Dyslipidemia: -His goal LDL is now less than 70, discuss statin with MD  3.  Hypertension: - he was started on amlodipine 5 mg daily at [place] -However, with CAD, would  prefer him to be on a beta-blocker. -Discuss changing amlodipine to metoprolol or carvedilol with MD  Otherwise, per IM Principal Problem:   Chest pain Active Problems:   Stroke-like episode  (HCC) s/p tPA   Essential hypertension   Anxiety   Abnormal stress test     For questions or updates, please contact [facility] Please consult [email or web address] for contact info under Cardiology/STEMI.   Signed, Theodore Demark, PA-C  12/01/2019 11:56 AM  History and all data above reviewed.  Patient examined.  I agree with the findings as above.  The patient developed chest pain several days ago.  Has not had this type of pain before.  It comes and goes.  It happens at rest.  He cannot bring it on with activities.  He was going up to his left arm.  He finally presented when the pain was becoming more intense and frequent.  It would last for several minutes at a time.  He told his primary provider about it was sent to the hospital.  He has results of testing as above.  Is never had any prior cardiac history.  With the patient exam reveals COR:RRR, no rub  ,  Lungs: Clear  ,  Abd: Positive bowel sounds, no rebound no guarding, Ext No edema  .  All available labs, radiology testing, previous records reviewed. Agree with documented assessment and he finally presented to   chest pain:  Abnormal stress test.  Cath as above.    Dyslipidemia:  Goals of therapy will be based on there results of the cath.  I will agree with moderate dose statin for now.   HTN:  BP is at target.  Continue current therapy.      Chest pain: Abnormal perfusion study.  Given this cardiac catheterization is indicated.  Risks and benefits have been explained and he agrees to proceed.  Fatigue: He complains of this infrequent daytime somnolence but he does not snore and has had a negative sleep study.  He says this is really been going on since Covid.  He is not anemic.  I will defer further work-up to his primary provider.  HTN: Blood pressure has been elevated and started on amlodipine.  He also needs weight loss and exercise and we did discuss this.  Robert Velez  12:42 PM  12/01/2019

## 2019-12-01 NOTE — Interval H&P Note (Signed)
History and Physical Interval Note:  12/01/2019 2:47 PM  Robert Velez  has presented today for surgery, with the diagnosis of abnormal stress test.  The various methods of treatment have been discussed with the patient and family. After consideration of risks, benefits and other options for treatment, the patient has consented to  Procedure(s): LEFT HEART CATH AND CORONARY ANGIOGRAPHY (N/A) as a surgical intervention.  The patient's history has been reviewed, patient examined, no change in status, stable for surgery.  I have reviewed the patient's chart and labs.  Questions were answered to the patient's satisfaction.    Cath Lab Visit (complete for each Cath Lab visit)  Clinical Evaluation Leading to the Procedure:   ACS: No.  Non-ACS:    Anginal Classification: CCS III  Anti-ischemic medical therapy: No Therapy  Non-Invasive Test Results: Intermediate-risk stress test findings: cardiac mortality 1-3%/year  Prior CABG: No previous CABG        Verne Carrow

## 2019-12-01 NOTE — Discharge Summary (Signed)
Physician Discharge Summary  Robert Velez NOB:[id number] DOB: 1975/10/06  PCP: [name]  Admitted from: Home Discharged to: Home  Admit date: 12/01/2019 Discharge date: 12/02/2019  Recommendations for Outpatient Follow-up:   Follow-up Information    [facility], [facility]. Schedule an appointment as soon as possible for a visit in 1 week(s).   Specialty: Family Medicine Why: To be seen with repeat labs (CBC & BMP). Contact information: [address] [place] [address] [phone]        [name], DO .   Specialty: Cardiology Contact information: [address] [place] [address] [phone]            Home Health: None Equipment/Devices: None  Discharge Condition: Improved and stable CODE STATUS: Full Diet recommendation: Heart healthy diet.  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  DM (patient denies and last A1c on 07/30/2016: 5.5) seen by his PCP 6/2 for chest pain and dyspnea that started 3 days prior.  He was sent to [facility] ED and admitted.  Cardiac enzymes were negative for MI.  Cardiology was consulted.  Lexiscan Myoview had abnormalities and he was transferred to [facility] for further cardiology evaluation and cardiac cath.  Tribes his chest pain as  precordial/substernal, started 3 days PTA, intermittent, pressure-like, at times would get severe to 10/10, at times radiating to left upper extremity, varying duration, both at rest and with exertion, associated with some dyspnea and nausea but no vomiting or diaphoresis.  Denies family history of CAD but indicates that his mother had heart problems, A. fib in her 75s.  Patient was placed on IV heparin and NTG drips and transferred to [facility].  Cardiology was consulted.  Assessment and plan:  1. Chest pain with abnormal stress test: Cardiology was consulted.  He underwent cardiac cath on 6/4 without evidence of CAD.  Cardiology cleared him for discharge after post cath observation care.  Prior home dose of aspirin was continued.  Given absence of CAD, empirically started statins were not continued and will defer to his PCP regarding reinitiating this.  Patient had been discharged on 6/4 but last night developed headache, some nausea and vomiting and thereby discharge was canceled.  Reassessed him this morning, asymptomatic of headache, no further nausea and vomiting.  Cardiology note appreciated-no further cardiac work-up planned and do not recommend any cardiology follow-up either.  Lifestyle changes were advised. 2. Dyslipidemia: In the absence of CAD by cath, discontinued empirically started statins and will defer to PCP regarding further evaluation and management.  Do not see any recent fasting lipids in care everywhere and can be done as outpatient.  Again lifestyle changes were advised. 3. Essential hypertension: Continue newly started amlodipine 5 mg daily.  Controlled. 4. OSA: Patient reported fatigue and infrequent daytime somnolence.  Sleep study results as noted above.  CPAP was recommended but does not appear to be  using it.  Recommended close outpatient follow-up with PCP to consider repeating sleep study and consideration for CPAP.  He verbalized understanding. 5. Obesity/Body mass  index is 37.82 kg/m.:  Lifestyle modifications and weight loss were recommended. 6. History of CVA with residual left hemiparesis: Stable.  Continue aspirin.  Again defer dyslipidemia management to his PCP. 7. Headache: Post cath patient developed some headache, nausea and vomiting which have since resolved.  This may be multifactorial due to NPO for several hours prior to cardiac cath, sleep disturbance and OSA.  No strokelike symptoms.  Has occasional headaches historically.  Received a dose of IV Toradol last night.  No further work-up.  Recommended follow-up with PCP if he has recurrence and he verbalized understanding.   Consultations:  Cardiology  Procedures:  Cardiac cath 6/4   Discharge Instructions  Discharge Instructions    Call MD for:  difficulty breathing, headache or visual disturbances   Complete by: As directed    Call MD for:  extreme fatigue   Complete by: As directed    Call MD for:  persistant dizziness or light-headedness   Complete by: As directed    Call MD for:  redness, tenderness, or signs of infection (pain, swelling, redness, odor or green/yellow discharge around incision site)   Complete by: As directed    Call MD for:  severe uncontrolled pain   Complete by: As directed    Diet - low sodium heart healthy   Complete by: As directed    Increase activity slowly   Complete by: As directed        Medication List    TAKE these medications   amLODipine 5 MG tablet Commonly known as: NORVASC Take 1 tablet (5 mg total) by mouth daily.   aspirin 81 MG EC tablet Take 1 tablet (81 mg total) by mouth daily.   celecoxib 200 MG capsule Commonly known as: CELEBREX Take 200 mg by mouth 2 (two) times daily.   citalopram 40 MG tablet Commonly known as: CELEXA Take 40 mg by mouth daily.   omeprazole 20 MG capsule Commonly known as: PRILOSEC Take 20 mg by mouth daily.      Allergies  Allergen Reactions  . Sulfamethoxazole Rash       Procedures/Studies: CARDIAC CATHETERIZATION  Result Date: 12/01/2019  The left ventricular systolic function is normal.  LV end diastolic pressure is normal.  The left ventricular ejection fraction is greater than 65% by visual estimate.  There is no mitral valve regurgitation.  1. No angiographic evidence of CAD 2. Normal LV systolic function Recommendations: No further ischemic workup      Subjective: Patient was seen yesterday morning prior to cath.  He denied any recurrence of chest pain since transfer from [facility] the night prior.  He was on IV heparin and NTG drips prior to cardiac cath.  Denied any other complaints. Overnight events noted-headache, nausea and vomiting prompting cancellation of discharge on 6/4. Asymptomatic this morning without headache, nausea, vomiting, chest pain.  Tolerated breakfast.  Eager to go home.  As per RN, no acute issues noted.  Discharge Exam:  Vitals:   12/02/19 0458 12/02/19 0840 12/02/19 0900 12/02/19 0947  BP: 127/85 116/87 132/89 132/89  Pulse: 71 83 87   Resp: 17 16    Temp: 98.3 F (36.8 C) 98.3 F (36.8 C) 98.3 F (36.8 C)   TempSrc: Oral Oral Oral   SpO2: 97% 97% 98%   Weight: 123 kg  Height:        General: Pleasant young male, moderately built and obese lying comfortably supine in bed without distress. Cardiovascular: S1 & S2 heard, RRR, S1/S2 +. No murmurs, rubs, gallops or clicks. No JVD or pedal edema.  Telemetry personally reviewed: Sinus rhythm. Respiratory: Clear to auscultation without wheezing, rhonchi or crackles. No increased work of breathing. Abdominal:  Non distended, non tender & soft. No organomegaly or masses appreciated. Normal bowel sounds heard. CNS: Alert and oriented. No focal deficits. Extremities: no edema, no cyanosis.  Right wrist cath site without acute findings.  Left upper extremity grip with grade 4+ by 5 power.  Rest of limbs grade 5 x 5 power.    The results of significant  diagnostics from this hospitalization (including imaging, microbiology, ancillary and laboratory) are listed below for reference.     Microbiology: No results found for this or any previous visit (from the past 240 hour(s)).   Labs: CBC: Recent Labs  Lab 12/01/19 0451 12/02/19 0705  WBC 7.7 7.6  HGB 15.9 15.9  HCT 48.4 49.3  MCV 86.9 87.9  PLT 267 252    Basic Metabolic Panel: Recent Labs  Lab 12/01/19 0451 12/02/19 0705  NA 134* 134*  K 4.3 4.4  CL 102 101  CO2 24 22  GLUCOSE 100* 80  BUN 13 17  CREATININE 0.93 0.98  CALCIUM 8.7* 9.0      Time coordinating discharge: 25 minutes  SIGNED:  Marcellus Scott, MD, FACP, [facility]. [facility]  To contact the attending provider between 7A-7P or the covering provider during after hours 7P-7A, please log into the web site [email or web address] and access using universal [place] password for that web site. If you do not have the password, please call the hospital operator.

## 2019-12-01 NOTE — H&P (Signed)
History and Physical    Robert Velez YWV:[id number] DOB: 1975-10-09 DOA: 12/01/2019  PCP: [facility]   Patient coming from: Home   Chief Complaint: Chest pain   HPI: Robert Velez is a 44 y.o. male with medical history significant for hypertension, anxiety, and TIA, now presenting to the emergency department with 3 days of chest pain.  The patient reports that he was at rest and had been in his usual state when he developed acute onset of chest discomfort described as though "something sitting on my chest."  There was radiation of pain to the left arm, 5/10 in intensity, and accompanied by shortness of breath without cough or fevers, and nausea without vomiting.  The patient had never experienced this previously.  He was unable to identify any alleviating or exacerbating factors but pain eventually resolved with sublingual nitroglycerin.  [facility] ED and Hospital course: Upon arrival to the ED, patient is found to be hemodynamically stable and saturating adequately on room air.  Troponin was undetectable.  Chemistry panel and CBC were unremarkable.  Chest x-ray was negative for acute findings.  Chest pain resolved with nitroglycerin and did not return.  Patient was given 3 and 24 mg of aspirin.  He was admitted to the hospitalist service.  He remained chest pain-free.  He underwent stress test that was concerning for reversible ischemia, was seen by cardiology, and it was recommended that the patient be started on IV heparin and transferred to [facility].  Review of Systems:  All other systems reviewed and apart from HPI, are negative.  Past Medical History:  Diagnosis Date  . Anemia   . Diabetes mellitus without complication ([place])   . Stroke [facility])     Past Surgical History:  Procedure Laterality Date  . TYMPANOPLASTY WITH GRAFT Right    1998     reports that he has never smoked. He quit smokeless tobacco use about 3 years ago.  His smokeless tobacco use  included snuff. He reports that he does not drink alcohol or use drugs.  No Known Allergies  Family History  Problem Relation Age of Onset  . Cancer Father        at 33y  . Cancer Maternal Grandmother   . Heart disease Maternal Grandfather      Prior to Admission medications   Medication Sig Start Date End Date Taking? Authorizing Provider  aspirin EC 81 MG EC tablet Take 1 tablet (81 mg total) by mouth daily. 08/01/16   Donzetta Starch, NP  citalopram (CELEXA) 40 MG tablet Take 40 mg by mouth daily.    [provider]  Cyanocobalamin (VITAMIN B 12 PO) Take 1 tablet by mouth daily. 2558mcg once daily    [provider]  omeprazole (PRILOSEC) 20 MG capsule Take 20 mg by mouth daily.    [provider]  rosuvastatin (CRESTOR) 10 MG tablet Take 10 mg by mouth daily.    [provider]    Physical Exam: Vitals:   12/01/19 0100  BP: 122/80  Pulse: 75  Resp: 18  Temp: 98.6 F (37 C)  TempSrc: Oral  SpO2: 96%  Weight: 123.5 kg  Height: 5\' 11"  (1.803 m)    Constitutional: NAD, calm  Eyes: PERTLA, lids and conjunctivae normal ENMT: Mucous membranes are moist. Posterior pharynx clear of any exudate or lesions.   Neck: normal, supple, no masses, no thyromegaly Respiratory:  no wheezing, no crackles. No accessory muscle use.  Cardiovascular: S1 & S2  heard, regular rate and rhythm. No extremity edema.  Abdomen: No distension, no tenderness, soft. Bowel sounds active.  Musculoskeletal: no clubbing / cyanosis. No joint deformity upper and lower extremities.   Skin: no significant rashes, lesions, ulcers. Warm, dry, well-perfused. Neurologic: No facial asymmetry. Sensation intact. Moving all extremities.  Psychiatric: Alert and oriented to person, place, and situation. Pleasant and cooperative.    Labs and Imaging on Admission: I have personally reviewed following labs and imaging studies  CBC: No results for input(s): WBC, NEUTROABS, HGB, HCT,  MCV, PLT in the last 168 hours. Basic Metabolic Panel: No results for input(s): NA, K, CL, CO2, GLUCOSE, BUN, CREATININE, CALCIUM, MG, PHOS in the last 168 hours. GFR: CrCl cannot be calculated (Patient's most recent lab result is older than the maximum 21 days allowed.). Liver Function Tests: No results for input(s): AST, ALT, ALKPHOS, BILITOT, PROT, ALBUMIN in the last 168 hours. No results for input(s): LIPASE, AMYLASE in the last 168 hours. No results for input(s): AMMONIA in the last 168 hours. Coagulation Profile: No results for input(s): INR, PROTIME in the last 168 hours. Cardiac Enzymes: No results for input(s): CKTOTAL, CKMB, CKMBINDEX, TROPONINI in the last 168 hours. BNP (last 3 results) No results for input(s): PROBNP in the last 8760 hours. HbA1C: No results for input(s): HGBA1C in the last 72 hours. CBG: No results for input(s): GLUCAP in the last 168 hours. Lipid Profile: No results for input(s): CHOL, HDL, LDLCALC, TRIG, CHOLHDL, LDLDIRECT in the last 72 hours. Thyroid Function Tests: No results for input(s): TSH, T4TOTAL, FREET4, T3FREE, THYROIDAB in the last 72 hours. Anemia Panel: No results for input(s): VITAMINB12, FOLATE, FERRITIN, TIBC, IRON, RETICCTPCT in the last 72 hours. Urine analysis:    Component Value Date/Time   COLORURINE YELLOW 07/30/2016 0356   APPEARANCEUR CLEAR 07/30/2016 0356   LABSPEC 1.025 07/30/2016 0356   PHURINE 6.0 07/30/2016 0356   GLUCOSEU NEGATIVE 07/30/2016 0356   HGBUR NEGATIVE 07/30/2016 0356   BILIRUBINUR NEGATIVE 07/30/2016 0356   KETONESUR NEGATIVE 07/30/2016 0356   PROTEINUR NEGATIVE 07/30/2016 0356   NITRITE NEGATIVE 07/30/2016 0356   LEUKOCYTESUR NEGATIVE 07/30/2016 0356   Sepsis Labs: @LABRCNTIP (procalcitonin:4,lacticidven:4) )No results found for this or any previous visit (from the past 240 hour(s)).   Radiological Exams on Admission: No results found.  EKG: Independently reviewed. Sinus rhythm.    Assessment/Plan   1. Chest pain; abnormal stress test  - Admitted to [facility] with 3 days of chest pain, eventually relieved with nitroglycerin, had undetectable troponin, stress test was concerning for moderate-sized area of reversible ischemia, and cardiologist recommended IV heparin and transfer to [facility]  - Patient reports no chest pain since admission, appears well  - Continue cardiac monitoring, continue IV heparin, continue ASA and Lipitor, discuss with cardiology in am for consideration of catheterization   2. Hypertension  - BP at goal, continue Norvasc    3. History of TIA  - Continue ASA and statin     DVT prophylaxis: IV heparin  Code Status: Full  Family Communication: Discussed with patient  Disposition Plan:  Patient is from: Home  Anticipated d/c is to: Home  Anticipated d/c date is: 12/02/19 Patient currently: pending cardiology evaluation  Consults called: None  Admission status: Observation     02/01/20, MD [facility] Pager: See [email or web address]  If 7AM-7PM, please contact the daytime attending [email or web address]  12/01/2019, 2:22 AM

## 2019-12-01 NOTE — Progress Notes (Addendum)
ANTICOAGULATION CONSULT NOTE - Initial Consult  Pharmacy Consult for heparin Indication: chest pain/ACS  No Known Allergies  Patient Measurements: Height: 5\' 11"  (180.3 cm) Weight: 123.5 kg (272 lb 3.2 oz) IBW/kg (Calculated) : 75.3 Heparin Dosing Weight: 103 kg  Vital Signs: Temp: 98.6 F (37 C) (06/04 0100) Temp Source: Oral (06/04 0100) BP: 122/80 (06/04 0100) Pulse Rate: 75 (06/04 0100)  Labs: No results for input(s): HGB, HCT, PLT, APTT, LABPROT, INR, HEPARINUNFRC, HEPRLOWMOCWT, CREATININE, CKTOTAL, CKMB, TROPONINIHS in the last 72 hours.  CrCl cannot be calculated (Patient's most recent lab result is older than the maximum 21 days allowed.).  Assessment: CC/HPI: 44 yo m presented to  6/3 for CP eval - had + stress test, echo showed 59% EF - transferred for cath  PMH: DM  Anticoag: none pta - iv hep for ACS - started ~ 1600 6/3  Goal of Therapy:  Heparin level 0.3-0.7 units/ml Monitor platelets by anticoagulation protocol: Yes   Plan:  Continue heparin 1400 units/hr Daily hep lvl cbc F/u plans for cath in am  8/3, PharmD, BCPS, BCCCP Clinical Pharmacist [phone]  Please check AMION for all [facility] Pharmacy numbers  12/01/2019 2:09 AM  6:25 AM Addendum:  Hep lvl back this am is 0.26 - low  Plan: Increase heparin to 1550 units/hr Recheck at 1300

## 2019-12-01 NOTE — Progress Notes (Addendum)
TR BAND REMOVAL  LOCATION:  right radial  DEFLATED PER PROTOCOL:  Yes.    TIME BAND OFF / DRESSING APPLIED:   1830   SITE UPON ARRIVAL:   Level 0  SITE AFTER BAND REMOVAL:  Level 0  CIRCULATION SENSATION AND MOVEMENT:  Within Normal Limits  Yes.    COMMENTS:  Upon arrival from cath lab, patient C/O HA & nausea.  Given Hydrocodone and zofran with partial relief.

## 2019-12-01 NOTE — Progress Notes (Signed)
Order received from on call Blount X, NP to give Toradol 30 mg x 1, and to hold discharge and monitor patient overnight. Pt was still c/o headache 3/10. Toradol administered per order. Will continue to monitor patient.

## 2019-12-01 NOTE — Discharge Instructions (Addendum)
Please get your medications reviewed and adjusted by your Primary MD. ° °Please request your Primary MD to go over all Hospital Tests and Procedure/Radiological results at the follow up, please get all Hospital records sent to your Prim MD by signing hospital release before you go home. ° °If you had Pneumonia of Lung problems at the Hospital: °Please get a 2 view Chest X ray done in 6-8 weeks after hospital discharge or sooner if instructed by your Primary MD. ° °If you have Congestive Heart Failure: °Please call your Cardiologist or Primary MD anytime you have any of the following symptoms:  °1) 3 pound weight gain in 24 hours or 5 pounds in 1 week  °2) shortness of breath, with or without a dry hacking cough  °3) swelling in the hands, feet or stomach  °4) if you have to sleep on extra pillows at night in order to breathe ° °Follow cardiac low salt diet and 1.5 lit/day fluid restriction. ° °If you have diabetes °Accuchecks 4 times/day, Once in AM empty stomach and then before each meal. °Log in all results and show them to your primary doctor at your next visit. °If any glucose reading is under 80 or above 300 call your primary MD immediately. ° °If you have Seizure/Convulsions/Epilepsy: °Please do not drive, operate heavy machinery, participate in activities at heights or participate in high speed sports until you have seen by Primary MD or a Neurologist and advised to do so again. ° °If you had Gastrointestinal Bleeding: °Please ask your Primary MD to check a complete blood count within one week of discharge or at your next visit. Your endoscopic/colonoscopic biopsies that are pending at the time of discharge, will also need to followed by your Primary MD. ° °Get Medicines reviewed and adjusted. °Please take all your medications with you for your next visit with your Primary MD ° °Please request your Primary MD to go over all hospital tests and procedure/radiological results at the follow up, please ask your  Primary MD to get all Hospital records sent to his/her office. ° °If you experience worsening of your admission symptoms, develop shortness of breath, life threatening emergency, suicidal or homicidal thoughts you must seek medical attention immediately by calling 911 or calling your MD immediately  if symptoms less severe. ° °You must read complete instructions/literature along with all the possible adverse reactions/side effects for all the Medicines you take and that have been prescribed to you. Take any new Medicines after you have completely understood and accpet all the possible adverse reactions/side effects.  ° °Do not drive or operate heavy machinery when taking Pain medications.  ° °Do not take more than prescribed Pain, Sleep and Anxiety Medications ° °Special Instructions: If you have smoked or chewed Tobacco  in the last 2 yrs please stop smoking, stop any regular Alcohol  and or any Recreational drug use. ° °Wear Seat belts while driving. ° °Please note °You were cared for by a hospitalist during your hospital stay. If you have any questions about your discharge medications or the care you received while you were in the hospital after you are discharged, you can call the unit and asked to speak with the hospitalist on call if the hospitalist that took care of you is not available. Once you are discharged, your primary care physician will handle any further medical issues. Please note that NO REFILLS for any discharge medications will be authorized once you are discharged, as it is imperative that you   return to your primary care physician (or establish a relationship with a primary care physician if you do not have one) for your aftercare needs so that they can reassess your need for medications and monitor your lab values.  You can reach the hospitalist office at phone [phone] or fax [phone]   If you do not have a primary care physician, you can call [phone] for a physician  referral.   Heart-Healthy Eating Plan Heart-healthy meal planning includes:  Eating less unhealthy fats.  Eating more healthy fats.  Making other changes in your diet. Talk with your doctor or a diet specialist (dietitian) to create an eating plan that is right for you. What is my plan? Your doctor may recommend an eating plan that includes:  Total fat: ______% or less of total calories a day.  Saturated fat: ______% or less of total calories a day.  Cholesterol: less than _________mg a day. What are tips for following this plan? Cooking Avoid frying your food. Try to bake, boil, grill, or broil it instead. You can also reduce fat by:  Removing the skin from poultry.  Removing all visible fats from meats.  Steaming vegetables in water or broth. Meal planning   At meals, divide your plate into four equal parts: ? Fill one-half of your plate with vegetables and green salads. ? Fill one-fourth of your plate with whole grains. ? Fill one-fourth of your plate with lean protein foods.  Eat 4-5 servings of vegetables per day. A serving of vegetables is: ? 1 cup of raw or cooked vegetables. ? 2 cups of raw leafy greens.  Eat 4-5 servings of fruit per day. A serving of fruit is: ? 1 medium whole fruit. ?  cup of dried fruit. ?  cup of fresh, frozen, or canned fruit. ?  cup of 100% fruit juice.  Eat more foods that have soluble fiber. These are apples, broccoli, carrots, beans, peas, and barley. Try to get 20-30 g of fiber per day.  Eat 4-5 servings of nuts, legumes, and seeds per week: ? 1 serving of dried beans or legumes equals  cup after being cooked. ? 1 serving of nuts is  cup. ? 1 serving of seeds equals 1 tablespoon. General information  Eat more home-cooked food. Eat less restaurant, buffet, and fast food.  Limit or avoid alcohol.  Limit foods that are high in starch and sugar.  Avoid fried foods.  Lose weight if you are overweight.  Keep track of  how much salt (sodium) you eat. This is important if you have high blood pressure. Ask your doctor to tell you more about this.  Try to add vegetarian meals each week. Fats  Choose healthy fats. These include olive oil and canola oil, flaxseeds, walnuts, almonds, and seeds.  Eat more omega-3 fats. These include salmon, mackerel, sardines, tuna, flaxseed oil, and ground flaxseeds. Try to eat fish at least 2 times each week.  Check food labels. Avoid foods with trans fats or high amounts of saturated fat.  Limit saturated fats. ? These are often found in animal products, such as meats, butter, and cream. ? These are also found in plant foods, such as palm oil, palm kernel oil, and coconut oil.  Avoid foods with partially hydrogenated oils in them. These have trans fats. Examples are stick margarine, some tub margarines, cookies, crackers, and other baked goods. What foods can I eat? Fruits All fresh, canned (in natural juice), or frozen fruits. Vegetables Fresh or frozen  vegetables (raw, steamed, roasted, or grilled). Green salads. Grains Most grains. Choose whole wheat and whole grains most of the time. Rice and pasta, including brown rice and pastas made with whole wheat. Meats and other proteins Lean, well-trimmed beef, veal, pork, and lamb. Chicken and [place] without skin. All fish and shellfish. Wild duck, rabbit, pheasant, and venison. Egg whites or low-cholesterol egg substitutes. Dried beans, peas, lentils, and tofu. Seeds and most nuts. Dairy Low-fat or nonfat cheeses, including ricotta and mozzarella. Skim or 1% milk that is liquid, powdered, or evaporated. Buttermilk that is made with low-fat milk. Nonfat or low-fat yogurt. Fats and oils Non-hydrogenated (trans-free) margarines. Vegetable oils, including soybean, sesame, sunflower, olive, peanut, safflower, corn, canola, and cottonseed. Salad dressings or mayonnaise made with a vegetable oil. Beverages Mineral water. Coffee and  tea. Diet carbonated beverages. Sweets and desserts Sherbet, gelatin, and fruit ice. Small amounts of dark chocolate. Limit all sweets and desserts. Seasonings and condiments All seasonings and condiments. The items listed above may not be a complete list of foods and drinks you can eat. Contact a dietitian for more options. What foods should I avoid? Fruits Canned fruit in heavy syrup. Fruit in cream or butter sauce. Fried fruit. Limit coconut. Vegetables Vegetables cooked in cheese, cream, or butter sauce. Fried vegetables. Grains Breads that are made with saturated or trans fats, oils, or whole milk. Croissants. Sweet rolls. Donuts. High-fat crackers, such as cheese crackers. Meats and other proteins Fatty meats, such as hot dogs, ribs, sausage, bacon, rib-eye roast or steak. High-fat deli meats, such as salami and bologna. Caviar. Domestic duck and goose. Organ meats, such as liver. Dairy Cream, sour cream, cream cheese, and creamed cottage cheese. Whole-milk cheeses. Whole or 2% milk that is liquid, evaporated, or condensed. Whole buttermilk. Cream sauce or high-fat cheese sauce. Yogurt that is made from whole milk. Fats and oils Meat fat, or shortening. Cocoa butter, hydrogenated oils, palm oil, coconut oil, palm kernel oil. Solid fats and shortenings, including bacon fat, salt pork, lard, and butter. Nondairy cream substitutes. Salad dressings with cheese or sour cream. Beverages Regular sodas and juice drinks with added sugar. Sweets and desserts Frosting. Pudding. Cookies. Cakes. Pies. Milk chocolate or white chocolate. Buttered syrups. Full-fat ice cream or ice cream drinks. The items listed above may not be a complete list of foods and drinks to avoid. Contact a dietitian for more information. Summary  Heart-healthy meal planning includes eating less unhealthy fats, eating more healthy fats, and making other changes in your diet.  Eat a balanced diet. This includes fruits and  vegetables, low-fat or nonfat dairy, lean protein, nuts and legumes, whole grains, and heart-healthy oils and fats. This information is not intended to replace advice given to you by your health care provider. Make sure you discuss any questions you have with your health care provider. Document Revised: 08/19/2017 Document Reviewed: 07/23/2017 Elsevier Patient Education  2020 [place]  This sheet gives you information about how to care for yourself after your procedure. Your health care provider may also give you more specific instructions. If you have problems or questions, contact your health care provider. What can I expect after the procedure? After the procedure, it is common to have:  Bruising and tenderness at the catheter insertion area. Follow these instructions at home: Medicines  Take over-the-counter and prescription medicines only as told by your health care provider. Insertion site care  Follow instructions from your health care provider  about how to take care of your insertion site. Make sure you: ? Wash your hands with soap and water before you change your bandage (dressing). If soap and water are not available, use hand sanitizer. ? Change your dressing as told by your health care provider. ? Leave stitches (sutures), skin glue, or adhesive strips in place. These skin closures may need to stay in place for 2 weeks or longer. If adhesive strip edges start to loosen and curl up, you may trim the loose edges. Do not remove adhesive strips completely unless your health care provider tells you to do that.  Check your insertion site every day for signs of infection. Check for: ? Redness, swelling, or pain. ? Fluid or blood. ? Pus or a bad smell. ? Warmth.  Do not take baths, swim, or use a hot tub until your health care provider approves.  You may shower 24-48 hours after the procedure, or as directed by your health care provider. ? Remove the  dressing and gently wash the site with plain soap and water. ? Pat the area dry with a clean towel. ? Do not rub the site. That could cause bleeding.  Do not apply powder or lotion to the site. Activity   For 24 hours after the procedure, or as directed by your health care provider: ? Do not flex or bend the affected arm. ? Do not push or pull heavy objects with the affected arm. ? Do not drive yourself home from the hospital or clinic. You may drive 24 hours after the procedure unless your health care provider tells you not to. ? Do not operate machinery or power tools.  Do not lift anything that is heavier than 10 lb (4.5 kg), or the limit that you are told, until your health care provider says that it is safe.  Ask your health care provider when it is okay to: ? Return to work or school. ? Resume usual physical activities or sports. ? Resume sexual activity. General instructions  If the catheter site starts to bleed, raise your arm and put firm pressure on the site. If the bleeding does not stop, get help right away. This is a medical emergency.  If you went home on the same day as your procedure, a responsible adult should be with you for the first 24 hours after you arrive home.  Keep all follow-up visits as told by your health care provider. This is important. Contact a health care provider if:  You have a fever.  You have redness, swelling, or yellow drainage around your insertion site. Get help right away if:  You have unusual pain at the radial site.  The catheter insertion area swells very fast.  The insertion area is bleeding, and the bleeding does not stop when you hold steady pressure on the area.  Your arm or hand becomes pale, cool, tingly, or numb. These symptoms may represent a serious problem that is an emergency. Do not wait to see if the symptoms will go away. Get medical help right away. Call your local emergency services (911 in the [place] Do not drive  yourself to the hospital. Summary  After the procedure, it is common to have bruising and tenderness at the site.  Follow instructions from your health care provider about how to take care of your radial site wound. Check the wound every day for signs of infection.  Do not lift anything that is heavier than 10 lb (4.5 kg), or the  limit that you are told, until your health care provider says that it is safe. This information is not intended to replace advice given to you by your health care provider. Make sure you discuss any questions you have with your health care provider. Document Revised: 07/21/2017 Document Reviewed: 07/21/2017 Elsevier Patient Education  2020 [employer].

## 2019-12-02 DIAGNOSIS — R079 Chest pain, unspecified: Secondary | ICD-10-CM | POA: Diagnosis not present

## 2019-12-02 LAB — CBC
HCT: 49.3 % (ref 39.0–52.0)
Hemoglobin: 15.9 g/dL (ref 13.0–17.0)
MCH: 28.3 pg (ref 26.0–34.0)
MCHC: 32.3 g/dL (ref 30.0–36.0)
MCV: 87.9 fL (ref 80.0–100.0)
Platelets: 252 10*3/uL (ref 150–400)
RBC: 5.61 MIL/uL (ref 4.22–5.81)
RDW: 12.9 % (ref 11.5–15.5)
WBC: 7.6 10*3/uL (ref 4.0–10.5)
nRBC: 0 % (ref 0.0–0.2)

## 2019-12-02 LAB — BASIC METABOLIC PANEL
Anion gap: 11 (ref 5–15)
BUN: 17 mg/dL (ref 6–20)
CO2: 22 mmol/L (ref 22–32)
Calcium: 9 mg/dL (ref 8.9–10.3)
Chloride: 101 mmol/L (ref 98–111)
Creatinine, Ser: 0.98 mg/dL (ref 0.61–1.24)
GFR calc Af Amer: >60 mL/min (ref >60)
GFR calc non Af Amer: >60 mL/min (ref >60)
Glucose, Bld: 80 mg/dL (ref 70–99)
Potassium: 4.4 mmol/L (ref 3.5–5.1)
Sodium: 134 mmol/L — ABNORMAL LOW (ref 135–145)

## 2019-12-02 NOTE — Progress Notes (Signed)
Consult note reviewed, cath yesterday with normal coronaries. No further cardiac workup planned at this time, defer further management to primary team. We will sign off inpatient care. No cardiology follow up is neccesary.     Dina Rich MD

## 2019-12-02 NOTE — Progress Notes (Addendum)
Progress Note  Kindly refer to DC summary from 6/4 which was updated and completed today.  Patient had been discharged on 6/4 but discharge had to be canceled due to some headache, nausea and vomiting.  Those have since resolved.  I interviewed and examined patient, discharged him home today in stable condition.  Marcellus Scott, MD, [place], [facility]. [facility]  To contact the attending provider between 7A-7P or the covering provider during after hours 7P-7A, please log into the web site [email or web address] and access using universal [place] password for that web site. If you do not have the password, please call the hospital operator.

## 2019-12-06 ENCOUNTER — Encounter: Payer: Self-pay | Admitting: *Deleted

## 2019-12-06 ENCOUNTER — Other Ambulatory Visit: Payer: Self-pay | Admitting: *Deleted

## 2019-12-06 DIAGNOSIS — H8113 Benign paroxysmal vertigo, bilateral: Secondary | ICD-10-CM

## 2019-12-06 HISTORY — DX: Benign paroxysmal vertigo, bilateral: H81.13

## 2019-12-08 ENCOUNTER — Ambulatory Visit (INDEPENDENT_AMBULATORY_CARE_PROVIDER_SITE_OTHER): Payer: Managed Care, Other (non HMO) | Admitting: Cardiology

## 2019-12-08 ENCOUNTER — Other Ambulatory Visit: Payer: Self-pay

## 2019-12-08 ENCOUNTER — Encounter: Payer: Self-pay | Admitting: Cardiology

## 2019-12-08 VITALS — BP 126/82 | HR 98 | Ht 71.0 in | Wt 272.0 lb

## 2019-12-08 DIAGNOSIS — E669 Obesity, unspecified: Secondary | ICD-10-CM | POA: Diagnosis not present

## 2019-12-08 DIAGNOSIS — E782 Mixed hyperlipidemia: Secondary | ICD-10-CM

## 2019-12-08 DIAGNOSIS — Z8673 Personal history of transient ischemic attack (TIA), and cerebral infarction without residual deficits: Secondary | ICD-10-CM

## 2019-12-08 DIAGNOSIS — I10 Essential (primary) hypertension: Secondary | ICD-10-CM

## 2019-12-08 MED ORDER — LISINOPRIL 5 MG PO TABS
5.0000 mg | ORAL_TABLET | Freq: Every day | ORAL | 3 refills | Status: DC
Start: 2019-12-08 — End: 2020-12-10

## 2019-12-08 NOTE — Progress Notes (Signed)
Cardiology Office Note:    Date:  12/08/2019   ID:  Birdie Hopes, DOB [birth year], MRN [id number]  PCP:  [name]  Cardiologist:  [name], DO  Electrophysiologist:  None   Referring MD: Physicians, [name]*   " I doing better but I feel dizzy"  History of Present Illness:    Albie Arizpe is a 44 y.o. 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   The patient was admitted to [facility] for chest pain.  During his visit her did get a pharmacologic stress test which was reported to be abnormal - positive for ischemia. He was transferred to [facility] for Left heart cath. He did undergo the left heart cath which showed normal coronaries.   Past Medical History:  Diagnosis Date  . Abnormal stress test 12/01/2019  . Anemia   . Anxiety 07/31/2016  . Arthritis, multiple joint involvement 07/16/2017  . Benign paroxysmal positional vertigo due to bilateral vestibular disorder 12/06/2019  . Chest pain 12/01/2019  . Chest pain of uncertain etiology   . CVA (cerebral vascular accident) (HCC) 07/16/2017  . Essential hypertension 07/31/2016  . GERD (gastroesophageal reflux disease) 07/31/2016  . Hyperlipidemia   . Hypertension   . Labile mood 07/16/2017  . Lateral epicondylitis of right elbow 07/27/2019  . Mixed dyslipidemia 07/16/2017  . Onychomycosis of toenail 07/26/2019  . Otitis media, serous, tm rupture, right 01/19/2017  . Right elbow pain 07/26/2019  . Somnolence, daytime 09/22/2016  . Stroke (HCC) 07/29/2016   Received TPA at an outside hospital  . Stroke-like  episode (HCC) s/p tPA 07/30/2016  . Tissue plasminogen activator (t-PA) administered at other facility within 24 hours prior to current admission     Past Surgical History:  Procedure Laterality Date  . LEFT HEART CATH AND CORONARY ANGIOGRAPHY N/A 12/01/2019   Procedure: LEFT HEART CATH AND CORONARY ANGIOGRAPHY;  Surgeon: Kathleene Hazel, MD;  [facility];  Service: Cardiovascular;  Laterality: N/A;  . TYMPANOPLASTY WITH GRAFT Right    1998    Current Medications: Current Meds  Medication Sig  . aspirin EC 81 MG EC tablet Take 1 tablet (81 mg total) by mouth daily.  . celecoxib (CELEBREX) 200 MG capsule Take 200 mg by mouth 2 (two) times daily.  . citalopram (CELEXA) 40 MG tablet Take 40 mg by mouth daily.  . nitroGLYCERIN (NITROSTAT) 0.4 MG SL tablet Place 0.4 mg under the tongue as directed.  Marland Kitchen omeprazole (PRILOSEC) 20 MG capsule Take 20 mg by mouth daily.  . [DISCONTINUED] amLODipine (NORVASC) 5 MG tablet Take 1 tablet (5 mg total) by mouth daily.     Allergies:   Sulfamethoxazole   Social History   Socioeconomic History  . Marital status: Married    Spouse name: Not on file  . Number of children: Not on file  . Years of education: Not on file  . Highest education level: Not on file  Occupational History  . Occupation: Sports administrator  Tobacco Use  . Smoking status: Never Smoker  . Smokeless tobacco: Former Neurosurgeon    Types: Snuff  Substance and Sexual Activity  . Alcohol  use: No  . Drug use: No  . Sexual activity: Yes  Other Topics Concern  . Not on file  Social History Narrative   Lives at home with wife and children.  Lives in [place].  Drinks sweet Tea.     Social Determinants of Health   Financial Resource Strain:   . Difficulty of Paying Living Expenses:   Food Insecurity:   . Worried About Programme researcher, broadcasting/film/video in the Last Year:   . Barista in the Last Year:   Transportation Needs:   . Freight forwarder  (Medical):   Marland Kitchen Lack of Transportation (Non-Medical):   Physical Activity:   . Days of Exercise per Week:   . Minutes of Exercise per Session:   Stress:   . Feeling of Stress :   Social Connections:   . Frequency of Communication with Friends and Family:   . Frequency of Social Gatherings with Friends and Family:   . Attends Religious Services:   . Active Member of Clubs or Organizations:   . Attends Banker Meetings:   Marland Kitchen Marital Status:      Family History: The patient's family history includes Cancer in his father and maternal grandmother; Heart disease in his maternal grandfather.  ROS:   Review of Systems  Constitution: Negative for decreased appetite, fever and weight gain.  HENT: Negative for congestion, ear discharge, hoarse voice and sore throat.   Eyes: Negative for discharge, redness, vision loss in right eye and visual halos.  Cardiovascular: Negative for chest pain, dyspnea on exertion, leg swelling, orthopnea and palpitations.  Respiratory: Negative for cough, hemoptysis, shortness of breath and snoring.   Endocrine: Negative for heat intolerance and polyphagia.  Hematologic/Lymphatic: Negative for bleeding problem. Does not bruise/bleed easily.  Skin: Negative for flushing, nail changes, rash and suspicious lesions.  Musculoskeletal: Negative for arthritis, joint pain, muscle cramps, myalgias, neck pain and stiffness.  Gastrointestinal: Negative for abdominal pain, bowel incontinence, diarrhea and excessive appetite.  Genitourinary: Negative for decreased libido, genital sores and incomplete emptying.  Neurological: Negative for brief paralysis, focal weakness, headaches and loss of balance.  Psychiatric/Behavioral: Negative for altered mental status, depression and suicidal ideas.  Allergic/Immunologic: Negative for HIV exposure and persistent infections.    EKGs/Labs/Other Studies Reviewed:    The following studies were reviewed today:   EKG:  The  ekg ordered today demonstrates sinus rhythm, heart rate 90 bpm  [facility] 12/01/2019  The left ventricular systolic function is normal.  LV end diastolic pressure is normal.  The left ventricular ejection fraction is greater than 65% by visual estimate.  There is no mitral valve regurgitation.  1. No angiographic evidence of CAD  2. Normal LV systolic function    Recent Labs: 12/02/2019: BUN 17; Creatinine, Ser 0.98; Hemoglobin 15.9; Platelets 252; Potassium 4.4; Sodium 134  Recent Lipid Panel    Component Value Date/Time   CHOL 176 07/30/2016 0350   TRIG 131 07/30/2016 0350   HDL 33 (L) 07/30/2016 0350   CHOLHDL 5.3 07/30/2016 0350   VLDL 26 07/30/2016 0350   LDLCALC 117 (H) 07/30/2016 0350    Physical Exam:    VS:  BP 126/82   Pulse 98   Ht 5\' 11"  (1.803 m)   Wt 272 lb (123.4 kg)   SpO2 98%   BMI 37.94 kg/m     Wt Readings from Last 3 Encounters:  12/08/19 272 lb (123.4 kg)  12/02/19 271 lb 2.7 oz (123  kg)  11/02/16 286 lb (129.7 kg)     GEN: Well nourished, well developed in no acute distress HEENT: Normal NECK: No JVD; No carotid bruits LYMPHATICS: No lymphadenopathy CARDIAC: S1S2 noted,RRR, no murmurs, rubs, gallops RESPIRATORY:  Clear to auscultation without rales, wheezing or rhonchi  ABDOMEN: Soft, non-tender, non-distended, +bowel sounds, no guarding. EXTREMITIES: No edema, No cyanosis, no clubbing MUSCULOSKELETAL:  No deformity  SKIN: Warm and dry NEUROLOGIC:  Alert and oriented x 3, non-focal PSYCHIATRIC:  Normal affect, good insight  ASSESSMENT:    1. Essential hypertension   2. History of CVA (cerebrovascular accident)   3. Mixed hyperlipidemia   4. Obesity (BMI 30-39.9)    PLAN:     1. His blood pressure is acceptable today but he tells me that he has been dizzy since starting the amlodipine. With this, I will stop the amlodipine and start the patient on lisinopril 5 mg daily  2. History of CVA - continue current medication regimen.   3.  Obesity - lifestyle modification diet and exercise.  The patient is in agreement with the above plan. The patient left the office in stable condition.  The patient will follow up in 1 month or sooner if needed.   Medication Adjustments/Labs and Tests Ordered: Current medicines are reviewed at length with the patient today.  Concerns regarding medicines are outlined above.  Orders Placed This Encounter  Procedures  . EKG 12-Lead   Meds ordered this encounter  Medications  . lisinopril (ZESTRIL) 5 MG tablet    Sig: Take 1 tablet (5 mg total) by mouth daily.    Dispense:  90 tablet    Refill:  3    Patient Instructions  Medication Instructions:  1) Stop Amlodipine   2) Start Lisinopril 5 mg daily   *If you need a refill on your cardiac medications before your next appointment, please call your pharmacy*   Lab Work: None ordered   If you have labs (blood work) drawn today and your tests are completely normal, you will receive your results only by: Marland Kitchen MyChart Message (if you have MyChart) OR . A paper copy in the mail If you have any lab test that is abnormal or we need to change your treatment, we will call you to review the results.   Testing/Procedures: None ordered    Follow-Up: At [facility], you and your health needs are our priority.  As part of our continuing mission to provide you with exceptional heart care, we have created designated Provider Care Teams.  These Care Teams include your primary Cardiologist (physician) and Advanced Practice Providers (APPs -  Physician Assistants and Nurse Practitioners) who all work together to provide you with the care you need, when you need it.  We recommend signing up for the patient portal called "MyChart".  Sign up information is provided on this After Visit Summary.  MyChart is used to connect with patients for Virtual Visits (Telemedicine).  Patients are able to view lab/test results, encounter notes, upcoming appointments,  etc.  Non-urgent messages can be sent to your provider as well.   To learn more about what you can do with MyChart, go to [email or web address].    Your next appointment:   1 month(s)  The format for your next appointment:   In Person  Provider:   [name], DO   Other Instructions None      Adopting a Healthy Lifestyle.  Know what a healthy weight is for you (roughly BMI <25) and  aim to maintain this   Aim for 7+ servings of fruits and vegetables daily   65-80+ fluid ounces of water or unsweet tea for healthy kidneys   Limit to max 1 drink of alcohol per day; avoid smoking/tobacco   Limit animal fats in diet for cholesterol and heart health - choose grass fed whenever available   Avoid highly processed foods, and foods high in saturated/trans fats   Aim for low stress - take time to unwind and care for your mental health   Aim for 150 min of moderate intensity exercise weekly for heart health, and weights twice weekly for bone health   Aim for 7-9 hours of sleep daily   When it comes to diets, agreement about the perfect plan isnt easy to find, even among the experts. Experts at the [employer] developed an idea known as the Healthy Eating Plate. Just imagine a plate divided into logical, healthy portions.   The emphasis is on diet quality:   Load up on vegetables and fruits - one-half of your plate: Aim for color and variety, and remember that potatoes dont count.   Go for whole grains - one-quarter of your plate: Whole wheat, barley, wheat berries, quinoa, oats, brown rice, and foods made with them. If you want pasta, go with whole wheat pasta.   Protein power - one-quarter of your plate: Fish, chicken, beans, and nuts are all healthy, versatile protein sources. Limit red meat.   The diet, however, does go beyond the plate, offering a few other suggestions.   Use healthy plant oils, such as olive, canola, soy, corn, sunflower and peanut.  Check the labels, and avoid partially hydrogenated oil, which have unhealthy trans fats.   If youre thirsty, drink water. Coffee and tea are good in moderation, but skip sugary drinks and limit milk and dairy products to one or two daily servings.   The type of carbohydrate in the diet is more important than the amount. Some sources of carbohydrates, such as vegetables, fruits, whole grains, and beans-are healthier than others.   Finally, stay active  Signed, Berniece Salines, DO  12/08/2019 8:41 PM    [facility]

## 2019-12-08 NOTE — Patient Instructions (Signed)
Medication Instructions:  1) Stop Amlodipine   2) Start Lisinopril 5 mg daily   *If you need a refill on your cardiac medications before your next appointment, please call your pharmacy*   Lab Work: None ordered   If you have labs (blood work) drawn today and your tests are completely normal, you will receive your results only by: Marland Kitchen MyChart Message (if you have MyChart) OR . A paper copy in the mail If you have any lab test that is abnormal or we need to change your treatment, we will call you to review the results.   Testing/Procedures: None ordered    Follow-Up: At [facility], you and your health needs are our priority.  As part of our continuing mission to provide you with exceptional heart care, we have created designated Provider Care Teams.  These Care Teams include your primary Cardiologist (physician) and Advanced Practice Providers (APPs -  Physician Assistants and Nurse Practitioners) who all work together to provide you with the care you need, when you need it.  We recommend signing up for the patient portal called "MyChart".  Sign up information is provided on this After Visit Summary.  MyChart is used to connect with patients for Virtual Visits (Telemedicine).  Patients are able to view lab/test results, encounter notes, upcoming appointments, etc.  Non-urgent messages can be sent to your provider as well.   To learn more about what you can do with MyChart, go to [email or web address].    Your next appointment:   1 month(s)  The format for your next appointment:   In Person  Provider:   [name], DO   Other Instructions None

## 2019-12-22 ENCOUNTER — Other Ambulatory Visit: Payer: Self-pay

## 2020-01-08 ENCOUNTER — Ambulatory Visit: Payer: Managed Care, Other (non HMO) | Admitting: Cardiology

## 2020-01-24 ENCOUNTER — Ambulatory Visit: Payer: Managed Care, Other (non HMO) | Admitting: Cardiology

## 2020-01-24 NOTE — Progress Notes (Deleted)
Cardiology Office Note:    Date:  01/24/2020   ID:  Robert Velez, DOB 23-Jan-1976, MRN [id number]  PCP:  [facility]  Cardiologist:  Robert Ripple, DO  Electrophysiologist:  None   Referring MD: Physicians, Robert Velez*   No chief complaint on file. ***  History of Present Illness:    Robert Velez is a 44 y.o. 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   The patient was admitted to [facility] for chest pain.  During his visit her did get a pharmacologic stress test which was reported to be abnormal - positive for ischemia. He was transferred to [facility] for Left heart cath. He did undergo the left heart cath which showed normal coronaries.    I saw the patient on January 07, 2020 post hospitalization.  He did tell me he was dizzy on amlodipine therefore stop the medication and started him on lisinopril 5 mg daily to help with his hypertension.    He is here today for follow-up visit  Past Medical History:  Diagnosis Date  . Abnormal stress test 12/01/2019  . Anemia   . Anxiety 07/31/2016  . Arthritis, multiple joint involvement 07/16/2017  . Benign paroxysmal positional vertigo due to bilateral vestibular disorder 12/06/2019  . Chest pain 12/01/2019  . Chest pain of uncertain etiology   . CVA (cerebral vascular accident) (HCC) 07/16/2017  . Essential hypertension 07/31/2016  . GERD (gastroesophageal reflux disease) 07/31/2016  . Hyperlipidemia   . Hypertension   . Labile mood 07/16/2017  . Lateral epicondylitis of right elbow 07/27/2019  . Mixed dyslipidemia 07/16/2017   . Onychomycosis of toenail 07/26/2019  . Otitis media, serous, tm rupture, right 01/19/2017  . Right elbow pain 07/26/2019  . Somnolence, daytime 09/22/2016  . Stroke (HCC) 07/29/2016   Received TPA at an outside hospital  . Stroke-like episode (HCC) s/p tPA 07/30/2016  . Tissue plasminogen activator (t-PA) administered at other facility within 24 hours prior to current admission     Past Surgical History:  Procedure Laterality Date  . LEFT HEART CATH AND CORONARY ANGIOGRAPHY N/A 12/01/2019   Procedure: LEFT HEART CATH AND CORONARY ANGIOGRAPHY;  Surgeon: Kathleene Hazel, MD;  Location: [facility] CV LAB;  Service: Cardiovascular;  Laterality: N/A;  . TYMPANOPLASTY WITH GRAFT Right    1998    Current Medications: No outpatient medications have been marked as taking for the 01/24/20 encounter (Appointment) with Robert Ripple, DO.     Allergies:   Sulfamethoxazole   Social History   Socioeconomic History  . Marital status: Married    Spouse name: Not on file  . Number of children: Not on file  . Years of education: Not on file  . Highest education level: Not on file  Occupational History  . Occupation: Sports administrator  Tobacco Use  . Smoking status: Never Smoker  . Smokeless tobacco: Former Neurosurgeon    Types: Snuff  Substance and Sexual Activity  . Alcohol use: No  . Drug use: No  . Sexual activity: Yes  Other Topics Concern  . Not on file  Social History Narrative   Lives at home with wife and children.  Lives in [place].  Drinks sweet Tea.     Social Determinants of Health   Financial Resource Strain:   . Difficulty of Paying Living Expenses:   Food Insecurity:   . Worried About Programme researcher, broadcasting/film/video in the Last Year:   . Barista in the Last Year:   Transportation Needs:   . Freight forwarder (Medical):   Marland Kitchen Lack of Transportation (Non-Medical):   Physical Activity:   . Days of Exercise per Week:   . Minutes of Exercise per Session:    Stress:   . Feeling of Stress :   Social Connections:   . Frequency of Communication with Friends and Family:   . Frequency of Social Gatherings with Friends and Family:   . Attends Religious Services:   . Active Member of Clubs or Organizations:   . Attends Banker Meetings:   Marland Kitchen Marital Status:      Family History: The patient's family history includes Cancer in his father and maternal grandmother; Heart disease in his maternal grandfather.  ROS:   Review of Systems  Constitution: Negative for decreased appetite, fever and weight gain.  HENT: Negative for congestion, ear discharge, hoarse voice and sore throat.   Eyes: Negative for discharge, redness, vision loss in right eye and visual halos.  Cardiovascular: Negative for chest pain, dyspnea on exertion, leg swelling, orthopnea and palpitations.  Respiratory: Negative for cough, hemoptysis, shortness of breath and snoring.   Endocrine: Negative for heat intolerance and polyphagia.  Hematologic/Lymphatic: Negative for bleeding problem. Does not bruise/bleed easily.  Skin: Negative for flushing, nail changes, rash and suspicious lesions.  Musculoskeletal: Negative for arthritis, joint pain, muscle cramps, myalgias, neck pain and stiffness.  Gastrointestinal: Negative for abdominal pain, bowel incontinence, diarrhea and excessive appetite.  Genitourinary: Negative for decreased libido, genital sores and incomplete emptying.  Neurological: Negative for brief paralysis, focal weakness, headaches and loss of balance.  Psychiatric/Behavioral: Negative for altered mental status, depression and suicidal ideas.  Allergic/Immunologic: Negative for HIV exposure and persistent infections.    EKGs/Labs/Other Studies Reviewed:    The following studies were reviewed today:   EKG:  The ekg ordered today demonstrates   [facility] 12/01/2019  The left ventricular systolic function is normal.  LV end diastolic pressure is  normal.  The left ventricular ejection fraction is greater than 65% by visual estimate.  There is no mitral valve regurgitation. 1. No angiographic evidence of CAD  2. Normal LV systolic function  Recent Labs: 12/02/2019: BUN 17; Creatinine, Ser 0.98; Hemoglobin 15.9; Platelets 252; Potassium 4.4; Sodium 134  Recent Lipid Panel    Component Value Date/Time   CHOL 176 07/30/2016 0350   TRIG 131 07/30/2016 0350   HDL 33 (L) 07/30/2016 0350   CHOLHDL 5.3 07/30/2016 0350   VLDL 26 07/30/2016 0350   LDLCALC 117 (H) 07/30/2016 0350    Physical Exam:    VS:  There were no vitals taken for this visit.    Wt Readings from Last 3 Encounters:  12/08/19 272 lb (123.4 kg)  12/02/19 271 lb 2.7 oz (123 kg)  11/02/16 286 lb (129.7 kg)     GEN: Well nourished, well developed in no acute distress HEENT: Normal NECK: No JVD; No carotid bruits LYMPHATICS: No lymphadenopathy CARDIAC: S1S2 noted,RRR, no murmurs, rubs, gallops RESPIRATORY:  Clear to auscultation without rales, wheezing or rhonchi  ABDOMEN: Soft, non-tender, non-distended, +bowel sounds, no guarding. EXTREMITIES: No edema, No cyanosis, no clubbing MUSCULOSKELETAL:  No deformity  SKIN: Warm and dry NEUROLOGIC:  Alert and oriented x 3, non-focal PSYCHIATRIC:  Normal affect, good insight  ASSESSMENT:    No diagnosis found. PLAN:     1.  The patient is in agreement with the above plan. The patient left the office in stable condition.  The patient will follow up in   Medication Adjustments/Labs and Tests Ordered: Current medicines are reviewed at length with the patient today.  Concerns regarding medicines are outlined above.  No orders of the defined types were placed in this encounter.  No orders of the defined types were placed in this encounter.   There are no Patient Instructions on file for this visit.   Adopting a Healthy Lifestyle.  Know what a healthy weight is for you (roughly BMI <25) and aim to  maintain this   Aim for 7+ servings of fruits and vegetables daily   65-80+ fluid ounces of water or unsweet tea for healthy kidneys   Limit to max 1 drink of alcohol per day; avoid smoking/tobacco   Limit animal fats in diet for cholesterol and heart health - choose grass fed whenever available   Avoid highly processed foods, and foods high in saturated/trans fats   Aim for low stress - take time to unwind and care for your mental health   Aim for 150 min of moderate intensity exercise weekly for heart health, and weights twice weekly for bone health   Aim for 7-9 hours of sleep daily   When it comes to diets, agreement about the perfect plan isnt easy to find, even among the experts. Experts at the [facility] of [employer] developed an idea known as the Healthy Eating Plate. Just imagine a plate divided into logical, healthy portions.   The emphasis is on diet quality:   Load up on vegetables and fruits - one-half of your plate: Aim for color and variety, and remember that potatoes dont count.   Go for whole grains - one-quarter of your plate: Whole wheat, barley, wheat berries, quinoa, oats, brown rice, and foods made with them. If you want pasta, go with whole wheat pasta.   Protein power - one-quarter of your plate: Fish, chicken, beans, and nuts are all healthy, versatile protein sources. Limit red meat.   The diet, however, does go beyond the plate, offering a few other suggestions.   Use healthy plant oils, such as olive, canola, soy, corn, sunflower and peanut. Check the labels, and avoid partially hydrogenated oil, which have unhealthy trans fats.   If youre thirsty, drink water. Coffee and tea are good in moderation, but skip sugary drinks and limit milk and dairy products to one or two daily servings.   The type of carbohydrate in the diet is more important than the amount. Some sources of carbohydrates, such as vegetables, fruits, whole grains, and beans-are  healthier than others.   Finally, stay active  Signed, Robert Ripple, DO  01/24/2020 12:03 PM    [facility]

## 2020-12-10 ENCOUNTER — Other Ambulatory Visit: Payer: Self-pay | Admitting: Cardiology

## 2020-12-10 NOTE — Telephone Encounter (Signed)
Refill sent to pharmacy.   

## 2023-09-16 ENCOUNTER — Encounter (HOSPITAL_BASED_OUTPATIENT_CLINIC_OR_DEPARTMENT_OTHER): Payer: Self-pay | Admitting: *Deleted

## 2023-09-16 ENCOUNTER — Ambulatory Visit (HOSPITAL_BASED_OUTPATIENT_CLINIC_OR_DEPARTMENT_OTHER): Admission: EM | Admit: 2023-09-16 | Discharge: 2023-09-16 | Disposition: A | Discharge status: Home / Self Care

## 2023-09-16 DIAGNOSIS — R519 Headache, unspecified: Secondary | ICD-10-CM | POA: Diagnosis not present

## 2023-09-16 DIAGNOSIS — R200 Anesthesia of skin: Secondary | ICD-10-CM | POA: Diagnosis not present

## 2023-09-16 NOTE — ED Triage Notes (Signed)
 Patient states left sided facial numbness for 20 min earlier today, 2nd time this week it has happened.  Headache today, left eye twitching.  Hx of stroke in 2018.  Hasn't taken anything for headache today.

## 2023-09-16 NOTE — ED Provider Notes (Signed)
 [facility]    CSN: [id number] Arrival date & time: 09/16/23  1603      History   Chief Complaint Chief Complaint  Patient presents with   facial numbness    HPI Robert Velez is a 48 y.o. male.   Patient is a 48 year old male presents today with facial numbness on the left side, headache on the left side.  Reporting had 1 episode of facial numbness on Tuesday that lasted about 20 minutes and resolved.  No issues until today about 11:00 or so had another episode of left facial numbness, inability to move the left side of his face and then proceeded with headache.  Headache is on the left side.  The headache is rated 6 out of 10.  He is no longer having any facial numbness or facial droop.  He denies any visual changes, blurred vision, dizziness, lightheadedness, chest pain, shortness of breath, extremity weakness. History of CVA with administration of tPA back in 2018 and also history of MI Not currently on any blood thinner treatment     Past Medical History:  Diagnosis Date   Abnormal stress test 12/01/2019   Anemia    Anxiety 07/31/2016   Arthritis, multiple joint involvement 07/16/2017   Benign paroxysmal positional vertigo due to bilateral vestibular disorder 12/06/2019   Chest pain 12/01/2019   Chest pain of uncertain etiology    CVA (cerebral vascular accident) (HCC) 07/16/2017   Essential hypertension 07/31/2016   GERD (gastroesophageal reflux disease) 07/31/2016   Hyperlipidemia    Hypertension    Labile mood 07/16/2017   Lateral epicondylitis of right elbow 07/27/2019   Mixed dyslipidemia 07/16/2017   Onychomycosis of toenail 07/26/2019   Otitis media, serous, tm rupture, right 01/19/2017   Right elbow pain 07/26/2019   Somnolence, daytime 09/22/2016   Stroke (HCC) 07/29/2016   Received TPA at an outside hospital   Stroke-like episode [facility]) s/p tPA 07/30/2016   Tissue plasminogen activator (t-PA) administered at other facility within 24 hours prior to current admission      Patient Active Problem List   Diagnosis Date Noted   Benign paroxysmal positional vertigo due to bilateral vestibular disorder 12/06/2019   Chest pain 12/01/2019   Abnormal stress test 12/01/2019   Chest pain of uncertain etiology    Lateral epicondylitis of right elbow 07/27/2019   Onychomycosis of toenail 07/26/2019   Right elbow pain 07/26/2019   Arthritis, multiple joint involvement 07/16/2017   Labile mood 07/16/2017   Mixed dyslipidemia 07/16/2017   CVA (cerebral vascular accident) (HCC) 07/16/2017   Otitis media, serous, tm rupture, right 01/19/2017   Somnolence, daytime 09/22/2016   Essential hypertension 07/31/2016   Anxiety 07/31/2016   GERD (gastroesophageal reflux disease) 07/31/2016   Stroke-like episode (HCC) s/p tPA 07/30/2016   Hyperlipidemia    Tissue plasminogen activator (tPA) administered at other facility within 24 hours before current admission     Past Surgical History:  Procedure Laterality Date   LEFT HEART CATH AND CORONARY ANGIOGRAPHY N/A 12/01/2019   Procedure: LEFT HEART CATH AND CORONARY ANGIOGRAPHY;  Surgeon: Kathleene Hazel, MD;  [facility];  Service: Cardiovascular;  Laterality: N/A;   TYMPANOPLASTY WITH GRAFT Right    1998       Home Medications    Prior to Admission medications   Medication Sig Start Date End Date Taking? Authorizing Provider  amphetamine-dextroamphetamine (ADDERALL) 20 MG tablet Take 20 mg by mouth 2 (two) times daily. 09/06/23  Yes [provider]  citalopram (CELEXA) 40 MG tablet Take 1 tablet by mouth daily. 03/05/21  Yes [provider]  lisinopril (ZESTRIL) 10 MG tablet Take 10 mg by mouth daily. 08/10/23  Yes [provider]  tamsulosin (FLOMAX) 0.4 MG CAPS capsule Take by mouth. 07/05/23  Yes [provider]  aspirin EC 81 MG EC tablet Take 1 tablet (81 mg total) by mouth daily. 08/01/16   Layne Benton, NP  celecoxib (CELEBREX) 200 MG capsule Take 200 mg  by mouth 2 (two) times daily. 11/22/19   [provider]  citalopram (CELEXA) 40 MG tablet Take 40 mg by mouth daily.    [provider]  lisinopril (ZESTRIL) 5 MG tablet TAKE 1 TABLET(5 MG) BY MOUTH DAILY 12/10/20   Tobb, Kardie, DO  nitroGLYCERIN (NITROSTAT) 0.4 MG SL tablet Place 0.4 mg under the tongue as directed. 11/30/19   [provider]  omeprazole (PRILOSEC) 20 MG capsule Take 20 mg by mouth daily.    [provider]  omeprazole (PRILOSEC) 40 MG capsule Take 40 mg by mouth daily.    [provider]    Family History Family History  Problem Relation Age of Onset   Cancer Father        at 57y   Cancer Maternal Grandmother    Heart disease Maternal Grandfather     Social History Social History   Tobacco Use   Smoking status: Never   Smokeless tobacco: Current    Types: Snuff    Last attempt to quit: 07/29/2016  Vaping Use   Vaping status: Never Used  Substance Use Topics   Alcohol use: No   Drug use: No     Allergies   Sulfamethoxazole   Review of Systems Review of Systems   Physical Exam Triage Vital Signs ED Triage Vitals  Encounter Vitals Group     BP 09/16/23 1642 130/86     Systolic BP Percentile --      Diastolic BP Percentile --      Pulse Rate 09/16/23 1642 92     Resp 09/16/23 1642 18     Temp 09/16/23 1642 98.3 F (36.8 C)     Temp Source 09/16/23 1642 Oral     SpO2 09/16/23 1642 95 %     Weight 09/16/23 1639 245 lb (111.1 kg)     Height 09/16/23 1639 5\' 11"  (1.803 m)     Head Circumference --      Peak Flow --      Pain Score 09/16/23 1638 6     Pain Loc --      Pain Education --      Exclude from Growth Chart --    No data found.  Updated Vital Signs BP 130/86 (BP Location: Right Arm)   Pulse 92   Temp 98.3 F (36.8 C) (Oral)   Resp 18   Ht 5\' 11"  (1.803 m)   Wt 245 lb (111.1 kg)   SpO2 95%   BMI 34.17 kg/m   Visual Acuity Right Eye Distance:   Left Eye Distance:   Bilateral  Distance:    Right Eye Near:   Left Eye Near:    Bilateral Near:     Physical Exam Constitutional:      General: He is not in acute distress.    Appearance: Normal appearance. He is not ill-appearing, toxic-appearing or diaphoretic.  HENT:     Head: Normocephalic and atraumatic.     Nose: Nose normal.  Eyes:  General: No visual field deficit.    Extraocular Movements: Extraocular movements intact.     Pupils: Pupils are equal, round, and reactive to light.  Cardiovascular:     Rate and Rhythm: Normal rate and regular rhythm.  Pulmonary:     Effort: Pulmonary effort is normal.     Breath sounds: Normal breath sounds.  Musculoskeletal:        General: Normal range of motion.  Skin:    General: Skin is warm and dry.  Neurological:     General: No focal deficit present.     Mental Status: He is alert and oriented to person, place, and time.     Cranial Nerves: No cranial nerve deficit, dysarthria or facial asymmetry.     Sensory: Sensation is intact. No sensory deficit.     Motor: No weakness, tremor, atrophy, abnormal muscle tone, seizure activity or pronator drift.     Coordination: Coordination is intact.     Gait: Gait is intact.      UC Treatments / Results  Labs (all labs ordered are listed, but only abnormal results are displayed) Labs Reviewed - No data to display  EKG   Radiology No results found.  Procedures Procedures (including critical care time)  Medications Ordered in UC Medications - No data to display  Initial Impression / Assessment and Plan / UC Course  I have reviewed the triage vital signs and the nursing notes.  Pertinent labs & imaging results that were available during my care of the patient were reviewed by me and considered in my medical decision making (see chart for details).     Facial numbness and headache-based on  symptoms today, and Tuesday,  and patient history of CVA  concerning for TIAs. Patient's only current symptom  is headache to the left side which is 6 out of 10.  Other differentials include Bell's palsy, migraine versus TIA. Exam is normal Recommended based on medical history and symptoms to go to the ER for CT scan and further evaluation and management. Offered pt ride to ER via EMS and refused.  Pt going POV to local hospital.  Final Clinical Impressions(s) / UC Diagnoses   Final diagnoses:  Facial numbness  Acute nonintractable headache, unspecified headache type     Discharge Instructions      Please go to the ER for further evaluation and a CT scan based on your history of stroke. Recommend CT immediately     ED Prescriptions   None    PDMP not reviewed this encounter.   Janace Aris, FNP 09/16/23 1759

## 2023-09-16 NOTE — ED Notes (Signed)
 Psychologist, clinical Customer service provider line [phone].  Spoke to [place] and advised that prior authorization for head CT not needed. (CPT [id number])  Diagnosis Code:R20.0

## 2023-09-16 NOTE — Discharge Instructions (Signed)
 Please go to the ER for further evaluation and a CT scan based on your history of stroke. Recommend CT immediately

## 2023-09-16 NOTE — ED Notes (Signed)
 Patient is being discharged from the Urgent Care and advised to go to the Emergency Department via private vehicle . Per NP Jaci Lazier, patient is in need of higher level of care due to CT scan d/t history of stroke. Patient is aware and verbalizes understanding of plan of care.  Vitals:   09/16/23 1642  BP: 130/86  Pulse: 92  Resp: 18  Temp: 98.3 F (36.8 C)  SpO2: 95%

## 2023-09-17 DIAGNOSIS — I10 Essential (primary) hypertension: Secondary | ICD-10-CM | POA: Diagnosis not present

## 2023-09-23 ENCOUNTER — Encounter: Payer: Self-pay | Admitting: Cardiology

## 2023-09-23 ENCOUNTER — Ambulatory Visit: Attending: Cardiology | Admitting: Cardiology

## 2023-09-23 ENCOUNTER — Ambulatory Visit: Attending: Cardiology

## 2023-09-23 VITALS — BP 130/90 | HR 100 | Ht 71.0 in | Wt 253.0 lb

## 2023-09-23 DIAGNOSIS — Z8673 Personal history of transient ischemic attack (TIA), and cerebral infarction without residual deficits: Secondary | ICD-10-CM

## 2023-09-23 DIAGNOSIS — G459 Transient cerebral ischemic attack, unspecified: Secondary | ICD-10-CM | POA: Insufficient documentation

## 2023-09-23 DIAGNOSIS — I10 Essential (primary) hypertension: Secondary | ICD-10-CM

## 2023-09-23 DIAGNOSIS — E782 Mixed hyperlipidemia: Secondary | ICD-10-CM

## 2023-09-23 HISTORY — DX: Personal history of transient ischemic attack (TIA), and cerebral infarction without residual deficits: Z86.73

## 2023-09-23 HISTORY — DX: Transient cerebral ischemic attack, unspecified: G45.9

## 2023-09-23 NOTE — Patient Instructions (Signed)
 Medication Instructions:  Your physician recommends that you continue on your current medications as directed. Please refer to the Current Medication list given to you today.  *If you need a refill on your cardiac medications before your next appointment, please call your pharmacy*   Lab Work: None Ordered If you have labs (blood work) drawn today and your tests are completely normal, you will receive your results only by: MyChart Message (if you have MyChart) OR A paper copy in the mail If you have any lab test that is abnormal or we need to change your treatment, we will call you to review the results.   Testing/Procedures:  WHY IS MY DOCTOR PRESCRIBING ZIO? The Zio system is proven and trusted by physicians to detect and diagnose irregular heart rhythms -- and has been prescribed to hundreds of thousands of patients.  The FDA has cleared the Zio system to monitor for many different kinds of irregular heart rhythms. In a study, physicians were able to reach a diagnosis 90% of the time with the Zio system1.  You can wear the Zio monitor -- a small, discreet, comfortable patch -- during your normal day-to-day activity, including while you sleep, shower, and exercise, while it records every single heartbeat for analysis.  1Barrett, P., et al. Comparison of 24 Hour Holter Monitoring Versus 14 Day Novel Adhesive Patch Electrocardiographic Monitoring. American Journal of Medicine, 2014.  ZIO VS. HOLTER MONITORING The Zio monitor can be comfortably worn for up to 14 days. Holter monitors can be worn for 24 to 48 hours, limiting the time to record any irregular heart rhythms you may have. Zio is able to capture data for the 51% of patients who have their first symptom-triggered arrhythmia after 48 hours.1  LIVE WITHOUT RESTRICTIONS The Zio ambulatory cardiac monitor is a small, unobtrusive, and water-resistant patch--you might even forget you're wearing it. The Zio monitor records and stores  every beat of your heart, whether you're sleeping, working out, or showering.     Follow-Up: At [facility], you and your health needs are our priority.  As part of our continuing mission to provide you with exceptional heart care, we have created designated Provider Care Teams.  These Care Teams include your primary Cardiologist (physician) and Advanced Practice Providers (APPs -  Physician Assistants and Nurse Practitioners) who all work together to provide you with the care you need, when you need it.      Dear Birdie Hopes  You are scheduled for a TEE (Transesophageal Echocardiogram) on Monday, March 31 with Dr. Vincent Gros.  [facility]  DIET:  Nothing to eat or drink after midnight except a sip of water with medications (see medication instructions below)  MEDICATION INSTRUCTIONS:   LABS: CBC, BMP- today  FYI:  For your safety, and to allow [place] to monitor your vital signs accurately during the surgery/procedure we request: If you have artificial nails, gel coating, SNS etc, please have those removed prior to your surgery/procedure. Not having the nail coverings /polish removed may result in cancellation or delay of your surgery/procedure.  Your support person will be asked to wait in the waiting room during your procedure.  It is OK to have someone drop you off and come back when you are ready to be discharged.  You cannot drive after the procedure and will need someone to drive you home.  Bring your insurance cards.  *Special Note: Every effort is made to have your procedure done on time. Occasionally there are emergencies that occur  at the hospital that may cause delays. Please be patient if a delay does occur.      We recommend signing up for the patient portal called "MyChart".  Sign up information is provided on this After Visit Summary.  MyChart is used to connect with patients for Virtual Visits (Telemedicine).  Patients are able to view lab/test results, encounter  notes, upcoming appointments, etc.  Non-urgent messages can be sent to your provider as well.   To learn more about what you can do with MyChart, go to [email or web address].    Your next appointment:   6 week(s)  The format for your next appointment:   In Person  Provider:   Gypsy Balsam, MD    Other Instructions NA

## 2023-09-23 NOTE — Progress Notes (Unsigned)
 Cardiology Consultation:    Date:  09/23/2023   ID:  Robert Velez, DOB Nov 06, 1975, MRN [id number]  PCP:  Galvin Proffer, MD  Cardiologist:  Gypsy Balsam, MD   Referring MD: Galvin Proffer, MD   Chief Complaint  Patient presents with   Follow-up    History of Present Illness:    Robert Velez is a 48 y.o. male who is being seen today for the evaluation of TIA at the request of Hague, Imran P, MD. past medical history significant for CVA did not suffer from in 2018 with residual left side weakness.  Apparently workup at that time has been negative also history of dyslipidemia, GERD, obstructive sleep apnea however study done in 2010 showing only mild obstructive sleep apnea with no CPAP mask being used.  He has been doing quite well until March 28 which he showed up in the hospital with left side facial numbness and also left side of his mouth was dropping interestingly workup in the hospital was negative.  CT of his head was negative carotic evaluation with CT was negative, since that time he had 4 additional episodes of it.  However upon discharge from hospital he was put on aspirin Plavix as well as statin has been initiated.  Otherwise he is fine.  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  He is not taking any anticoagulants.  Echocardiogram done in the hospital showed normal left ventricle ejection fraction, his left atrium size was normal no significant valve pathology, intravenous injection of agitated saline did not show any evidence of intracardiac shunt.  He does not smoke he tried to ride bike on the regular basis not on any special diet has not seen neurology at  Past Medical History:  Diagnosis Date   Abnormal stress test 12/01/2019   Anemia    Anxiety 07/31/2016   Arthritis, multiple joint involvement 07/16/2017   Benign paroxysmal positional vertigo due to bilateral vestibular disorder 12/06/2019   Chest pain 12/01/2019   Chest pain of uncertain etiology     CVA (cerebral vascular accident) (HCC) 07/16/2017   Essential hypertension 07/31/2016   GERD (gastroesophageal reflux disease) 07/31/2016   Hyperlipidemia    Hypertension    Labile mood 07/16/2017   Lateral epicondylitis of right elbow 07/27/2019   Mixed dyslipidemia 07/16/2017   Onychomycosis of toenail 07/26/2019   Otitis media, serous, tm rupture, right 01/19/2017   Right elbow pain 07/26/2019   Somnolence, daytime 09/22/2016   Stroke (HCC) 07/29/2016   Received TPA at an outside hospital   Stroke-like episode [facility]) s/p tPA 07/30/2016   Tissue plasminogen activator (t-PA) administered at other facility within 24 hours prior to current admission     Past Surgical History:  Procedure Laterality Date   LEFT HEART CATH AND CORONARY ANGIOGRAPHY N/A 12/01/2019   Procedure: LEFT HEART CATH AND CORONARY ANGIOGRAPHY;  Surgeon: Kathleene Hazel, MD;  Location: [facility] CV LAB;  Service: Cardiovascular;  Laterality: N/A;   TYMPANOPLASTY WITH GRAFT Right    1998    Current Medications: Current Meds  Medication Sig   ACYCLOVIR PO Take 1 tablet by mouth in the morning and at bedtime.   amphetamine-dextroamphetamine (ADDERALL) 20 MG tablet Take 20 mg by mouth 2 (two) times daily.   aspirin EC 81 MG EC tablet Take 1 tablet (81 mg total) by mouth daily.   celecoxib (CELEBREX) 200 MG capsule Take 200 mg by mouth 2 (two) times daily.   citalopram (CELEXA)  40 MG tablet Take 1 tablet by mouth daily.   lisinopril (ZESTRIL) 10 MG tablet Take 10 mg by mouth daily.   omeprazole (PRILOSEC) 20 MG capsule Take 20 mg by mouth daily.   tamsulosin (FLOMAX) 0.4 MG CAPS capsule Take 0.4 mg by mouth daily after supper.   [DISCONTINUED] citalopram (CELEXA) 40 MG tablet Take 40 mg by mouth daily.   [DISCONTINUED] lisinopril (ZESTRIL) 5 MG tablet TAKE 1 TABLET(5 MG) BY MOUTH DAILY (Patient taking differently: Take 5 mg by mouth daily.)   [DISCONTINUED] nitroGLYCERIN (NITROSTAT) 0.4 MG SL tablet Place 0.4 mg under the  tongue as directed.   [DISCONTINUED] omeprazole (PRILOSEC) 40 MG capsule Take 40 mg by mouth daily.     Allergies:   Sulfamethoxazole   Social History   Socioeconomic History   Marital status: Married    Spouse name: Not on file   Number of children: Not on file   Years of education: Not on file   Highest education level: Not on file  Occupational History   Occupation: Sports administrator  Tobacco Use   Smoking status: Never   Smokeless tobacco: Current    Types: Snuff    Last attempt to quit: 07/29/2016  Vaping Use   Vaping status: Never Used  Substance and Sexual Activity   Alcohol use: No   Drug use: No   Sexual activity: Yes  Other Topics Concern   Not on file  Social History Narrative   Lives at home with wife and children.  Lives in [place].  Drinks sweet Tea.     Social Drivers of Corporate investment banker Strain: Not on file  Food Insecurity: Not on file  Transportation Needs: Not on file  Physical Activity: Not on file  Stress: Not on file  Social Connections: Not on file     Family History: The patient's family history includes Cancer in his father and maternal grandmother; Heart disease in his maternal grandfather. ROS:   Please see the history of present illness.    All 14 point review of systems negative except as described per history of present illness.  EKGs/Labs/Other Studies Reviewed:    The following studies were reviewed today: Echocardiogram review showing preserved ejection fraction no intracardiac shunt, mild to moderate aortic regurgitation  EKG:  EKG Interpretation Date/Time:  Thursday September 23 2023 08:50:14 EDT Ventricular Rate:  100 PR Interval:  154 QRS Duration:  80 QT Interval:  346 QTC Calculation: 446 R Axis:   2  Text Interpretation: Normal sinus rhythm Normal ECG When compared with ECG of 02-Dec-2019 04:58, No significant change was found Confirmed by Gypsy Balsam [phone]) on 09/23/2023 9:03:02 AM     Recent Labs: No results found for requested labs within last 365 days.  Recent Lipid Panel    Component Value Date/Time   CHOL 176 07/30/2016 0350   TRIG 131 07/30/2016 0350   HDL 33 (L) 07/30/2016 0350   CHOLHDL 5.3 07/30/2016 0350   VLDL 26 07/30/2016 0350   LDLCALC 117 (H) 07/30/2016 0350    Physical Exam:    VS:  BP (!) 130/90 (BP Location: Left Arm, Patient Position: Sitting)   Pulse 100   Ht 5\' 11"  (1.803 m)   Wt 253 lb (114.8 kg)   SpO2 98%   BMI 35.29 kg/m     Wt Readings from Last 3 Encounters:  09/23/23 253 lb (114.8 kg)  09/16/23 245 lb (111.1 kg)  12/08/19 272 lb (123.4 kg)  GEN:  Well nourished, well developed in no acute distress HEENT: Normal NECK: No JVD; No carotid bruits LYMPHATICS: No lymphadenopathy CARDIAC: RRR, no murmurs, no rubs, no gallops RESPIRATORY:  Clear to auscultation without rales, wheezing or rhonchi  ABDOMEN: Soft, non-tender, non-distended MUSCULOSKELETAL:  No edema; No deformity  SKIN: Warm and dry NEUROLOGIC:  Alert and oriented x 3 PSYCHIATRIC:  Normal affect   ASSESSMENT:    1. Essential hypertension   2. TIA (transient ischemic attack)   3. History of CVA (cerebrovascular accident)   4. Mixed hyperlipidemia    PLAN:    In order of problems listed above:  TIA with history of CVA obviously very concerning.  I recommend him to continue dual antiplatelets therapy until we figure out what is going on initial impression was only for 3 weeks but he is still having TIA episodes therefore I recommend to continue, continue with statin.  Obviously will look for any cardiac source of emboli.  He did have transthoracic echocardiogram which was practically unrevealing.  Will schedule him to have transesophageal echocardiogram and explained to him procedure including all risk benefits as well as alternative.  He is willing to proceed, I will also put monitor on him to look for evidence of atrial fibrillation, if his monitor will be  negative we will talk about implantable loop recorder. Mixed dyslipidemia will continue statin a few weeks will recheck his cholesterol. Essential hypertension.  Blood pressure slightly elevated today first visit to my office continue monitoring. I recommend strongly to see a neurologist quite quickly.   Medication Adjustments/Labs and Tests Ordered: Current medicines are reviewed at length with the patient today.  Concerns regarding medicines are outlined above.  Orders Placed This Encounter  Procedures   EKG 12-Lead   No orders of the defined types were placed in this encounter.   Signed, Georgeanna Lea, MD, [facility]. 09/23/2023 9:17 AM    [facility]

## 2023-09-24 ENCOUNTER — Telehealth: Payer: Self-pay

## 2023-09-24 LAB — BASIC METABOLIC PANEL WITH GFR
BUN/Creatinine Ratio: 18 (ref 9–20)
BUN: 17 mg/dL (ref 6–24)
CO2: 22 mmol/L (ref 20–29)
Calcium: 9.6 mg/dL (ref 8.7–10.2)
Chloride: 99 mmol/L (ref 96–106)
Creatinine, Ser: 0.93 mg/dL (ref 0.76–1.27)
Glucose: 89 mg/dL (ref 70–99)
Potassium: 4.5 mmol/L (ref 3.5–5.2)
Sodium: 136 mmol/L (ref 134–144)
eGFR: 101 mL/min/{1.73_m2} (ref >59)

## 2023-09-24 LAB — CBC
Hematocrit: 50 % (ref 37.5–51.0)
Hemoglobin: 17.1 g/dL (ref 13.0–17.7)
MCH: 29.9 pg (ref 26.6–33.0)
MCHC: 34.2 g/dL (ref 31.5–35.7)
MCV: 88 fL (ref 79–97)
Platelets: 305 10*3/uL (ref 150–450)
RBC: 5.71 x10E6/uL (ref 4.14–5.80)
RDW: 12.6 % (ref 11.6–15.4)
WBC: 5.5 10*3/uL (ref 3.4–10.8)

## 2023-09-24 NOTE — Telephone Encounter (Signed)
 Lab Results reviewed with pt as per Dr. Vanetta Shawl note.  Pt verbalized understanding and had no additional questions. Routed to PCP

## 2023-09-27 DIAGNOSIS — I351 Nonrheumatic aortic (valve) insufficiency: Secondary | ICD-10-CM | POA: Diagnosis not present

## 2023-09-28 ENCOUNTER — Encounter (HOSPITAL_COMMUNITY): Admission: RE | Payer: Self-pay | Source: Home / Self Care

## 2023-09-28 ENCOUNTER — Telehealth: Payer: Self-pay | Admitting: Cardiology

## 2023-09-28 ENCOUNTER — Ambulatory Visit (HOSPITAL_COMMUNITY): Admission: RE | Admit: 2023-09-28 | Source: Home / Self Care | Admitting: Internal Medicine

## 2023-09-28 SURGERY — TRANSESOPHAGEAL ECHOCARDIOGRAM (TEE) (CATHLAB)
Anesthesia: Monitor Anesthesia Care

## 2023-09-28 NOTE — Telephone Encounter (Signed)
 Called the patient and he stated that the monitor fell off and that is why it is out of connection. I explained that [employer] who is monitoring his heart via the Zio heart monitor has been trying to reach him and he should call them to determine what the next steps are with his heart monitor. He stated that he would call them. Patient had no further questions at this time.

## 2023-09-28 NOTE — Telephone Encounter (Signed)
 Caller Karin Golden) is reporting patient's heart monitor is currently out of connection and they have been unable to reach the patient.  Ticket # [phone]

## 2023-09-30 ENCOUNTER — Encounter: Payer: Self-pay | Admitting: Neurology

## 2023-09-30 ENCOUNTER — Ambulatory Visit (INDEPENDENT_AMBULATORY_CARE_PROVIDER_SITE_OTHER): Admitting: Neurology

## 2023-09-30 VITALS — BP 131/83 | HR 85 | Ht 71.0 in | Wt 253.0 lb

## 2023-09-30 DIAGNOSIS — Z8673 Personal history of transient ischemic attack (TIA), and cerebral infarction without residual deficits: Secondary | ICD-10-CM | POA: Diagnosis not present

## 2023-09-30 DIAGNOSIS — E7849 Other hyperlipidemia: Secondary | ICD-10-CM | POA: Diagnosis not present

## 2023-09-30 DIAGNOSIS — R29810 Facial weakness: Secondary | ICD-10-CM

## 2023-09-30 DIAGNOSIS — G459 Transient cerebral ischemic attack, unspecified: Secondary | ICD-10-CM
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
## 2023-09-30 NOTE — Patient Instructions (Signed)
 I had a long d/w patient about his recent recurrent TIAs, risk for recurrent stroke/TIAs, personally independently reviewed imaging studies and stroke evaluation results and answered questions.Continue aspirin 81 mg daily and clopidogrel 75 mg daily  for 3 weeks and then stop aspirin and stay on clopidogrel alone secondary stroke prevention and maintain strict control of hypertension with blood pressure goal below 130/90, diabetes with hemoglobin A1c goal below 6.5% and lipids with LDL cholesterol goal below 70 mg/dL. I also advised the patient to eat a healthy diet with plenty of whole grains, cereals, fruits and vegetables, exercise regularly and maintain ideal body weight continue Lipitor and check follow-up lipid profile in 2 months..  Follow results of ongoing cardiac monitor.  Followup in the future with me in 6 months or earlier if necessary.  Stroke Prevention Some medical conditions and behaviors can lead to a higher chance of having a stroke. You can help prevent a stroke by eating healthy, exercising, not smoking, and managing any medical conditions you have. Stroke is a leading cause of functional impairment. Primary prevention is particularly important because a majority of strokes are first-time events. Stroke changes the lives of not only those who experience a stroke but also their family and other caregivers. How can this condition affect me? A stroke is a medical emergency and should be treated right away. A stroke can lead to brain damage and can sometimes be life-threatening. If a person gets medical treatment right away, there is a better chance of surviving and recovering from a stroke. What can increase my risk? The following medical conditions may increase your risk of a stroke: Cardiovascular disease. High blood pressure (hypertension). Diabetes. High cholesterol. Sickle cell disease. Blood clotting disorders (hypercoagulable state). Obesity. Sleep disorders (obstructive  sleep apnea). Other risk factors include: Being older than age 38. Having a history of blood clots, stroke, or mini-stroke (transient ischemic attack, TIA). Genetic factors, such as race, ethnicity, or a family history of stroke. Smoking cigarettes or using other tobacco products. Taking birth control pills, especially if you also use tobacco. Heavy use of alcohol or drugs, especially cocaine and methamphetamine. Physical inactivity. What actions can I take to prevent this? Manage your health conditions High cholesterol levels. Eating a healthy diet is important for preventing high cholesterol. If cholesterol cannot be managed through diet alone, you may need to take medicines. Take any prescribed medicines to control your cholesterol as told by your health care provider. Hypertension. To reduce your risk of stroke, try to keep your blood pressure below 130/80. Eating a healthy diet and exercising regularly are important for controlling blood pressure. If these steps are not enough to manage your blood pressure, you may need to take medicines. Take any prescribed medicines to control hypertension as told by your health care provider. Ask your health care provider if you should monitor your blood pressure at home. Have your blood pressure checked every year, even if your blood pressure is normal. Blood pressure increases with age and some medical conditions. Diabetes. Eating a healthy diet and exercising regularly are important parts of managing your blood sugar (glucose). If your blood sugar cannot be managed through diet and exercise, you may need to take medicines. Take any prescribed medicines to control your diabetes as told by your health care provider. Get evaluated for obstructive sleep apnea. Talk to your health care provider about getting a sleep evaluation if you snore a lot or have excessive sleepiness. Make sure that any other medical conditions you  have, such as atrial  fibrillation or atherosclerosis, are managed. Nutrition Follow instructions from your health care provider about what to eat or drink to help manage your health condition. These instructions may include: Reducing your daily calorie intake. Limiting how much salt (sodium) you use to 1,500 milligrams (mg) each day. Using only healthy fats for cooking, such as olive oil, canola oil, or sunflower oil. Eating healthy foods. You can do this by: Choosing foods that are high in fiber, such as whole grains, and fresh fruits and vegetables. Eating at least 5 servings of fruits and vegetables a day. Try to fill one-half of your plate with fruits and vegetables at each meal. Choosing lean protein foods, such as lean cuts of meat, poultry without skin, fish, tofu, beans, and nuts. Eating low-fat dairy products. Avoiding foods that are high in sodium. This can help lower blood pressure. Avoiding foods that have saturated fat, trans fat, and cholesterol. This can help prevent high cholesterol. Avoiding processed and prepared foods. Counting your daily carbohydrate intake.  Lifestyle If you drink alcohol: Limit how much you have to: 0-1 drink a day for women who are not pregnant. 0-2 drinks a day for men. Know how much alcohol is in your drink. In the [place] one drink equals one 12 oz bottle of beer ( ), one 5 oz glass of wine ( ), or one 1 oz glass of hard liquor (44mL). Do not use any products that contain nicotine or tobacco. These products include cigarettes, chewing tobacco, and vaping devices, such as e-cigarettes. If you need help quitting, ask your health care provider. Avoid secondhand smoke. Do not use drugs. Activity  Try to stay at a healthy weight. Get at least 30 minutes of exercise on most days, such as: Fast walking. Biking. Swimming. Medicines Take over-the-counter and prescription medicines only as told by your health care provider. Aspirin or blood thinners (antiplatelets  or anticoagulants) may be recommended to reduce your risk of forming blood clots that can lead to stroke. Avoid taking birth control pills. Talk to your health care provider about the risks of taking birth control pills if: You are over 67 years old. You smoke. You get very bad headaches. You have had a blood clot. Where to find more information American Stroke Association: www.strokeassociation.org Get help right away if: You or a loved one has any symptoms of a stroke. "BE FAST" is an easy way to remember the main warning signs of a stroke: B - Balance. Signs are dizziness, sudden trouble walking, or loss of balance. E - Eyes. Signs are trouble seeing or a sudden change in vision. F - Face. Signs are sudden weakness or numbness of the face, or the face or eyelid drooping on one side. A - Arms. Signs are weakness or numbness in an arm. This happens suddenly and usually on one side of the body. S - Speech. Signs are sudden trouble speaking, slurred speech, or trouble understanding what people say. T - Time. Time to call emergency services. Write down what time symptoms started. You or a loved one has other signs of a stroke, such as: A sudden, severe headache with no known cause. Nausea or vomiting. Seizure. These symptoms may represent a serious problem that is an emergency. Do not wait to see if the symptoms will go away. Get medical help right away. Call your local emergency services (911 in the [place] Do not drive yourself to the hospital. Summary You can help to prevent a stroke by eating  healthy, exercising, not smoking, limiting alcohol intake, and managing any medical conditions you may have. Do not use any products that contain nicotine or tobacco. These include cigarettes, chewing tobacco, and vaping devices, such as e-cigarettes. If you need help quitting, ask your health care provider. Remember "BE FAST" for warning signs of a stroke. Get help right away if you or a loved one has  any of these signs. This information is not intended to replace advice given to you by your health care provider. Make sure you discuss any questions you have with your health care provider. Document Revised: 05/18/2022 Document Reviewed: 05/18/2022 Elsevier Patient Education  2024 [employer].

## 2023-10-06 ENCOUNTER — Ambulatory Visit (HOSPITAL_BASED_OUTPATIENT_CLINIC_OR_DEPARTMENT_OTHER)
Admission: EM | Admit: 2023-10-06 | Discharge: 2023-10-06 | Disposition: A | Discharge status: Home / Self Care | Attending: Nurse Practitioner | Admitting: Nurse Practitioner

## 2023-10-06 ENCOUNTER — Encounter (HOSPITAL_BASED_OUTPATIENT_CLINIC_OR_DEPARTMENT_OTHER): Payer: Self-pay | Admitting: Emergency Medicine

## 2023-10-06 ENCOUNTER — Ambulatory Visit (INDEPENDENT_AMBULATORY_CARE_PROVIDER_SITE_OTHER)
Admit: 2023-10-06 | Discharge: 2023-10-06 | Disposition: A | Discharge status: Home / Self Care | Payer: Worker's Compensation | Attending: Family Medicine | Admitting: Family Medicine

## 2023-10-06 DIAGNOSIS — M25572 Pain in left ankle and joints of left foot: Secondary | ICD-10-CM

## 2023-10-06 DIAGNOSIS — T148XXA Other injury of unspecified body region, initial encounter: Secondary | ICD-10-CM

## 2023-10-06 DIAGNOSIS — W2211XA Striking against or struck by driver side automobile airbag, initial encounter: Secondary | ICD-10-CM | POA: Diagnosis not present

## 2023-10-06 DIAGNOSIS — S161XXA Strain of muscle, fascia and tendon at neck level, initial encounter: Secondary | ICD-10-CM | POA: Diagnosis not present

## 2023-10-06 DIAGNOSIS — S46912A Strain of unspecified muscle, fascia and tendon at shoulder and upper arm level, left arm, initial encounter: Secondary | ICD-10-CM

## 2023-10-06 MED ORDER — TRAMADOL HCL 50 MG PO TABS: 50.0000 mg | ORAL_TABLET | Freq: Four times a day (QID) | ORAL | 0 refills | Status: AC | PRN

## 2023-10-06 MED ORDER — METHOCARBAMOL 500 MG PO TABS
500.0000 mg | ORAL_TABLET | Freq: Every morning | ORAL | 0 refills | Status: AC
Start: 2023-10-06 — End: ?

## 2023-10-06 MED ORDER — KETOROLAC TROMETHAMINE 60 MG/2ML IM SOLN
60.0000 mg | Freq: Once | INTRAMUSCULAR | Status: AC
  Administered 2023-10-06: 60 mg via INTRAMUSCULAR

## 2023-10-06 MED ORDER — CYCLOBENZAPRINE HCL 10 MG PO TABS: 10.0000 mg | ORAL_TABLET | Freq: Every day | ORAL | 0 refills | Status: AC
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
## 2023-10-07 ENCOUNTER — Encounter (HOSPITAL_BASED_OUTPATIENT_CLINIC_OR_DEPARTMENT_OTHER): Payer: Self-pay | Admitting: Emergency Medicine

## 2023-10-07 ENCOUNTER — Ambulatory Visit (HOSPITAL_BASED_OUTPATIENT_CLINIC_OR_DEPARTMENT_OTHER)
Admission: EM | Admit: 2023-10-07 | Discharge: 2023-10-07 | Disposition: A | Discharge status: Home / Self Care | Attending: Emergency Medicine | Admitting: Emergency Medicine

## 2023-10-07 DIAGNOSIS — S161XXD Strain of muscle, fascia and tendon at neck level, subsequent encounter: Secondary | ICD-10-CM

## 2023-10-07 DIAGNOSIS — R42 Dizziness and giddiness: Secondary | ICD-10-CM

## 2023-10-07 MED ORDER — PREDNISONE 10 MG (21) PO TBPK: ORAL_TABLET | Freq: Every day | ORAL | 0 refills | Status: AC

## 2023-10-07 MED ORDER — DEXAMETHASONE SODIUM PHOSPHATE 10 MG/ML IJ SOLN
10.0000 mg | Freq: Once | INTRAMUSCULAR | Status: AC
Start: 2023-10-07 — End: 2023-10-07
  Administered 2023-10-07: 10 mg via INTRAMUSCULAR
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
## 2023-10-07 NOTE — ED Triage Notes (Signed)
 Pt reports neck/ shoulder pain is worse since visit yesterday, he did take the medications that was prescribed for him.

## 2023-10-25 DIAGNOSIS — G459 Transient cerebral ischemic attack, unspecified: Secondary | ICD-10-CM | POA: Diagnosis not present

## 2023-10-28 ENCOUNTER — Telehealth: Payer: Self-pay

## 2023-10-28 NOTE — Telephone Encounter (Signed)
 Left message on My Chart with normal monitor results per Dr. Vanetta Shawl note. Routed to PCP.

## 2023-10-29 ENCOUNTER — Telehealth: Payer: Self-pay

## 2023-10-29 DIAGNOSIS — M545 Low back pain, unspecified: Secondary | ICD-10-CM | POA: Insufficient documentation

## 2023-10-29 NOTE — Telephone Encounter (Signed)
 Pt viewed monitor results on My Chart per Dr. Vanetta Shawl note. Routed to PCP.

## 2023-11-04 ENCOUNTER — Ambulatory Visit: Admitting: Cardiology

## 2023-11-16 ENCOUNTER — Telehealth: Payer: Self-pay | Admitting: Neurology

## 2023-11-16 ENCOUNTER — Encounter: Payer: Self-pay | Admitting: Neurology

## 2023-11-16 NOTE — Telephone Encounter (Signed)
 At 8:54 a.m. pt left a vm stating his PCP will not fill his  amphetamine-dextroamphetamine (ADDERALL) 20 MG tablet until Dr Janett Medin approves it. Pt stated he has been taking the  amphetamine-dextroamphetamine (ADDERALL) 20 MG tablet since his TIA.  Pt stated he really needs this medication for his job, please call pt to discuss.

## 2023-11-16 NOTE — Telephone Encounter (Signed)
 Confirmed with Dr Janett Medin that pt may remain on adderall prescribed by PCP.

## 2023-11-16 NOTE — Telephone Encounter (Signed)
 Called the patient back. His Primary care has been prescribing this medication for him but he didn't want to prescribe anymore until he got neurology approval.  The patient had never stopped taking the medication and has been taking this since his last TIA. Advised I'm not aware of any contraindications and will write up a letter and send to the patient's PCP.  I will confirm with Dr Janett Medin and as long as he agrees I will complete a letter and send to the PCP

## 2023-11-25 DIAGNOSIS — I10 Essential (primary) hypertension: Secondary | ICD-10-CM | POA: Insufficient documentation

## 2023-11-25 DIAGNOSIS — D649 Anemia, unspecified: Secondary | ICD-10-CM | POA: Insufficient documentation

## 2023-11-26 ENCOUNTER — Ambulatory Visit: Attending: Cardiology | Admitting: Cardiology

## 2023-12-09 ENCOUNTER — Ambulatory Visit: Admitting: Neurology

## 2024-07-03 ENCOUNTER — Ambulatory Visit: Admitting: Neurology

## 2024-07-06 DIAGNOSIS — I351 Nonrheumatic aortic (valve) insufficiency: Secondary | ICD-10-CM | POA: Diagnosis not present

## 2024-07-06 DIAGNOSIS — I517 Cardiomegaly: Secondary | ICD-10-CM | POA: Diagnosis not present
